# Patient Record
Sex: Female | Born: 1970 | Race: Black or African American | Hispanic: No | Marital: Single | Smoking: Never smoker
Health system: Southern US, Community
[De-identification: ages and names within clinical notes are randomized; demographics above are authoritative.]

## PROBLEM LIST (undated history)

## (undated) DIAGNOSIS — E7800 Pure hypercholesterolemia, unspecified: Secondary | ICD-10-CM

## (undated) DIAGNOSIS — I10 Essential (primary) hypertension: Secondary | ICD-10-CM

## (undated) HISTORY — PX: HERNIA REPAIR: SHX51

---

## 1997-10-27 ENCOUNTER — Emergency Department (HOSPITAL_COMMUNITY): Admission: EM | Admit: 1997-10-27 | Discharge: 1997-10-27 | Payer: Self-pay | Admitting: Emergency Medicine

## 2004-07-12 ENCOUNTER — Emergency Department (HOSPITAL_COMMUNITY): Admission: EM | Admit: 2004-07-12 | Discharge: 2004-07-13 | Payer: Self-pay | Admitting: Emergency Medicine

## 2005-02-23 ENCOUNTER — Ambulatory Visit (HOSPITAL_COMMUNITY): Admission: RE | Admit: 2005-02-23 | Discharge: 2005-02-23 | Payer: Self-pay | Admitting: Orthopedic Surgery

## 2005-12-19 ENCOUNTER — Emergency Department (HOSPITAL_COMMUNITY): Admission: EM | Admit: 2005-12-19 | Discharge: 2005-12-19 | Payer: Self-pay | Admitting: Family Medicine

## 2008-10-09 ENCOUNTER — Emergency Department (HOSPITAL_BASED_OUTPATIENT_CLINIC_OR_DEPARTMENT_OTHER): Admission: EM | Admit: 2008-10-09 | Discharge: 2008-10-09 | Payer: Self-pay | Admitting: Emergency Medicine

## 2009-04-26 ENCOUNTER — Ambulatory Visit: Payer: Self-pay | Admitting: Diagnostic Radiology

## 2009-04-26 ENCOUNTER — Emergency Department (HOSPITAL_BASED_OUTPATIENT_CLINIC_OR_DEPARTMENT_OTHER): Admission: EM | Admit: 2009-04-26 | Discharge: 2009-04-26 | Payer: Self-pay | Admitting: Emergency Medicine

## 2009-04-26 IMAGING — CT CT ABD-PELV W/ CM
2 of 5 series · 15 of 46 positions shown, 17 images · IV contrast (APPLIED)
Comparison: Abdominal radiograph performed earlier today at [DATE]
a.m.

CLINICAL DATA: Abdominal pain and nausea.  History of abdominal
wall hernia.

CT ABDOMEN AND PELVIS WITH CONTRAST
TECHNIQUE: Multidetector CT imaging of the abdomen and pelvis was
performed following the standard protocol during bolus
administration of intravenous contrast.
Contrast: 100 mL of Omnipaque 300 IV contrast

[Series 2: abd/pelvis 5.0 b31f · axial · 0.78mm/px · z∈[-399,+6]mm · 12 of 93 slices shown, 14 images]
[im 6/93  soft-tissue]
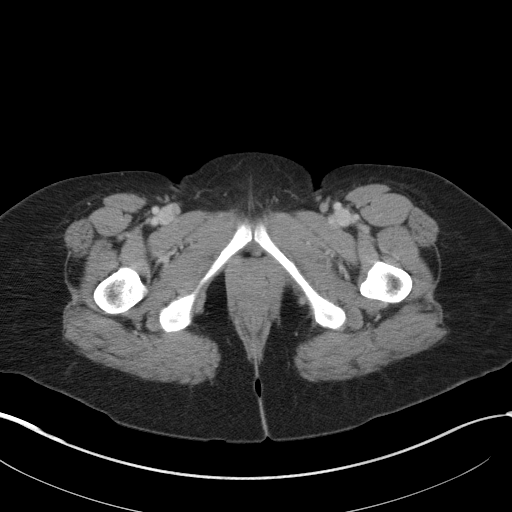
[im 6/93  bone]
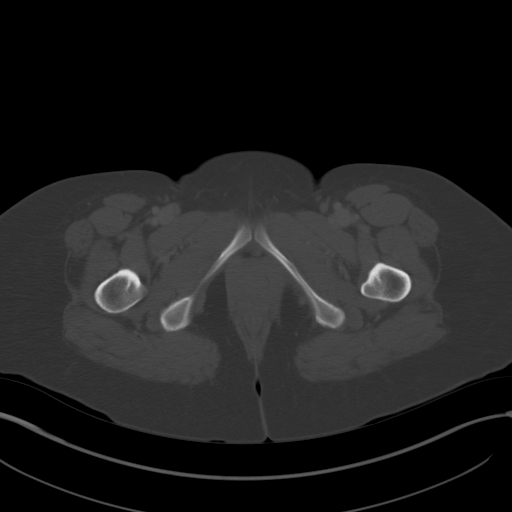
[im 16/93  soft-tissue]
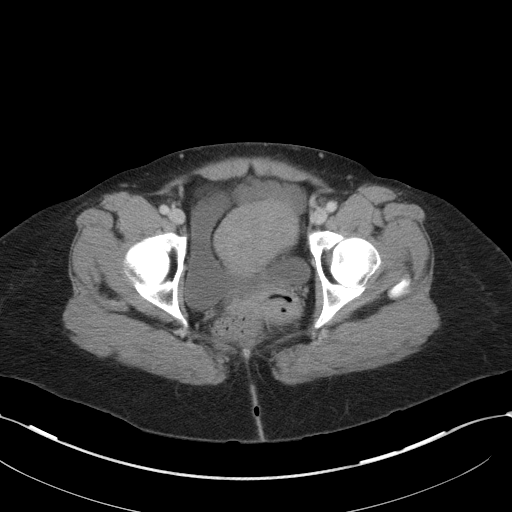
[im 21/93  soft-tissue]
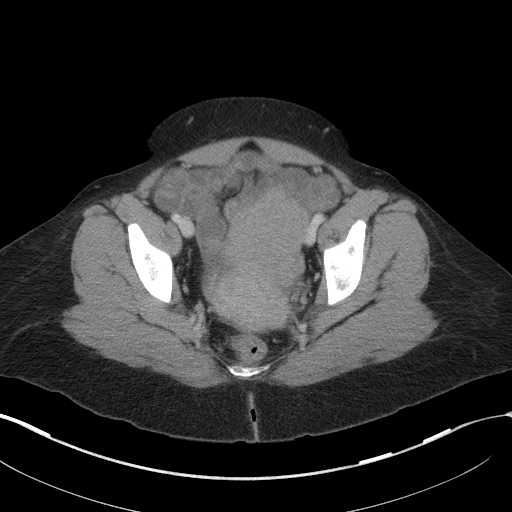
[im 26/93  soft-tissue]
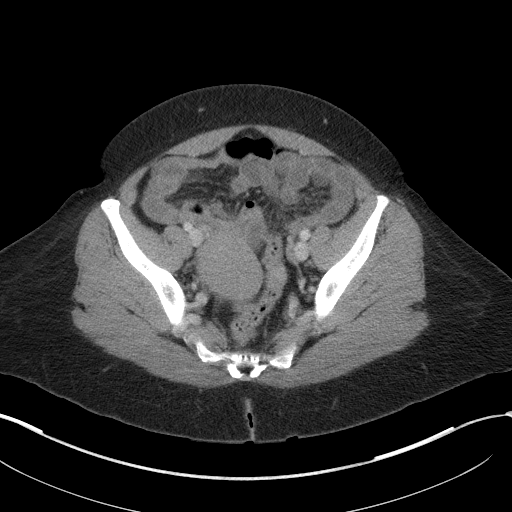
[im 36/93  soft-tissue]
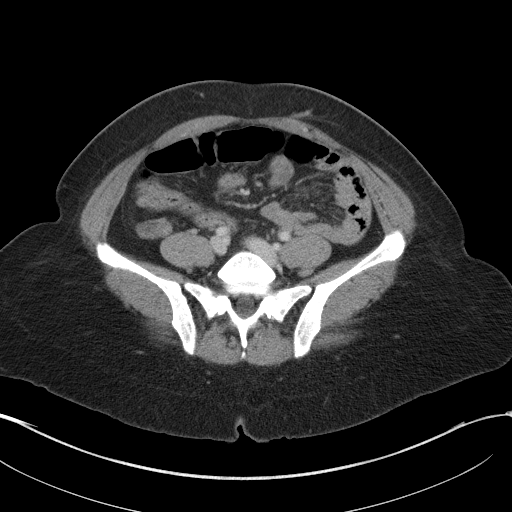
[im 41/93  soft-tissue]
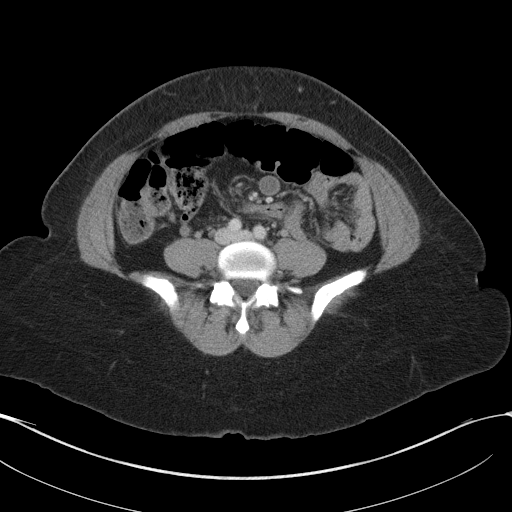
[im 52/93  soft-tissue]
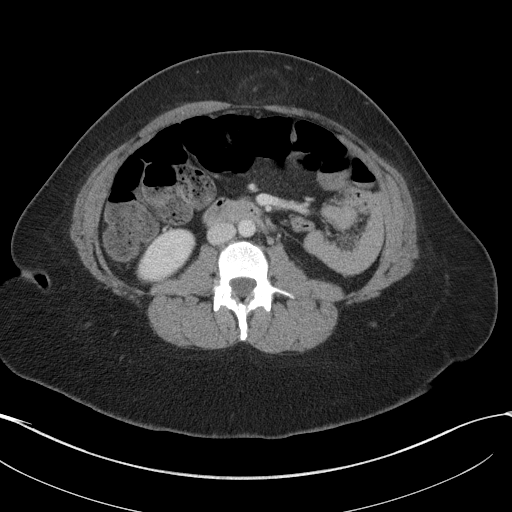
[im 57/93  soft-tissue]
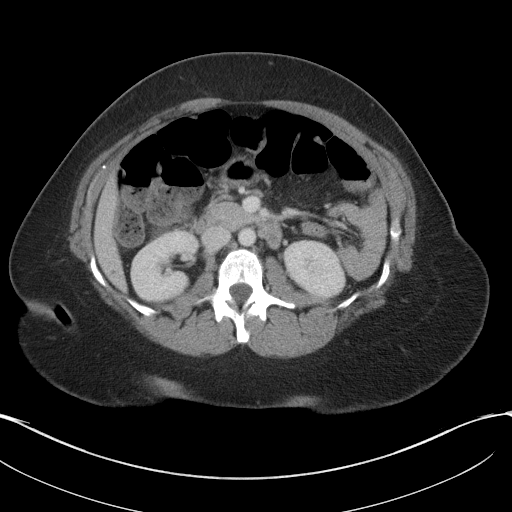
[im 67/93  soft-tissue]
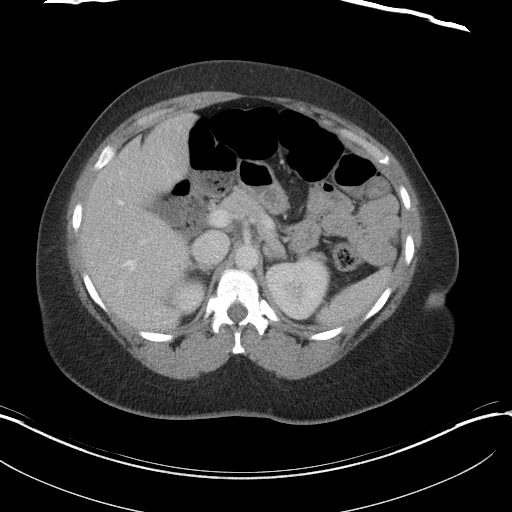
[im 67/93  bone]
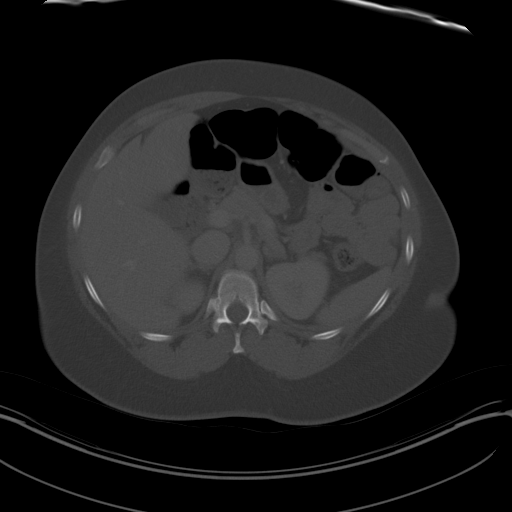
[im 72/93  soft-tissue]
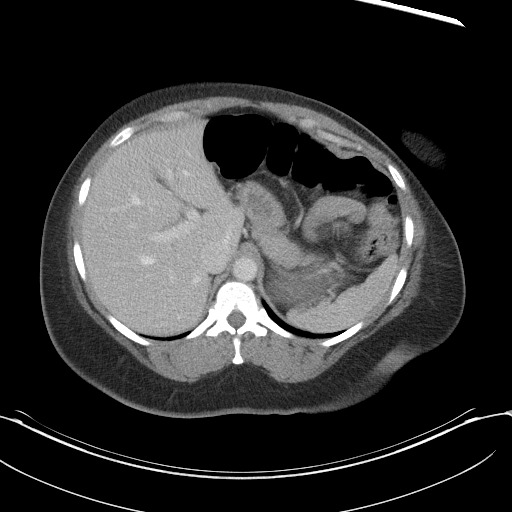
[im 77/93  soft-tissue]
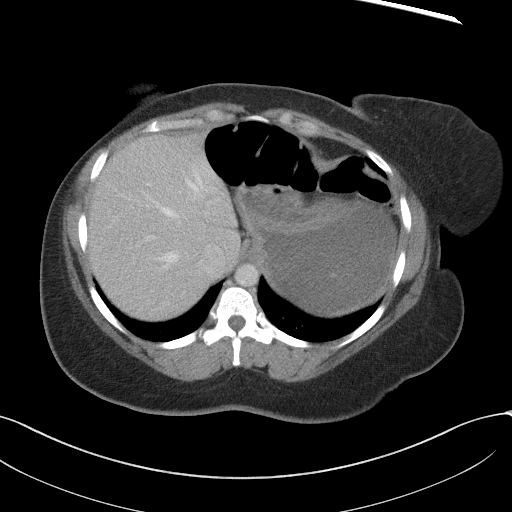
[im 87/93  soft-tissue]
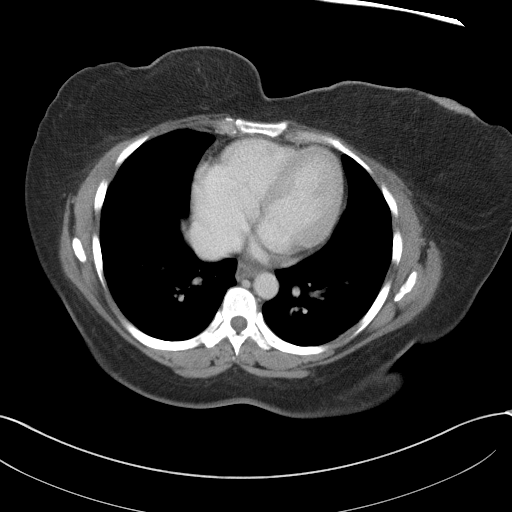

[Series 5: abd/pelvis 3.0 coronal · coronal · 0.81mm/px · 3 of 99 slices shown]
[im 33/99  soft-tissue]
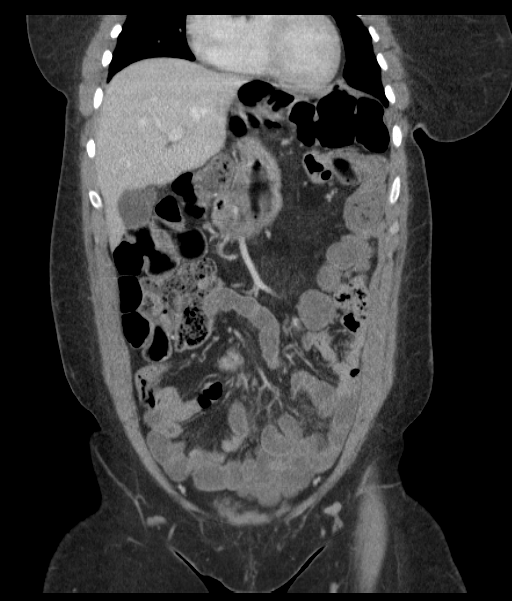
[im 44/99  soft-tissue]
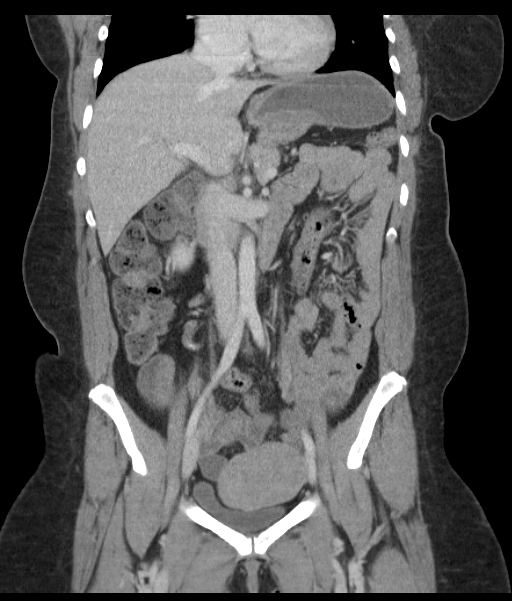
[im 55/99  soft-tissue]
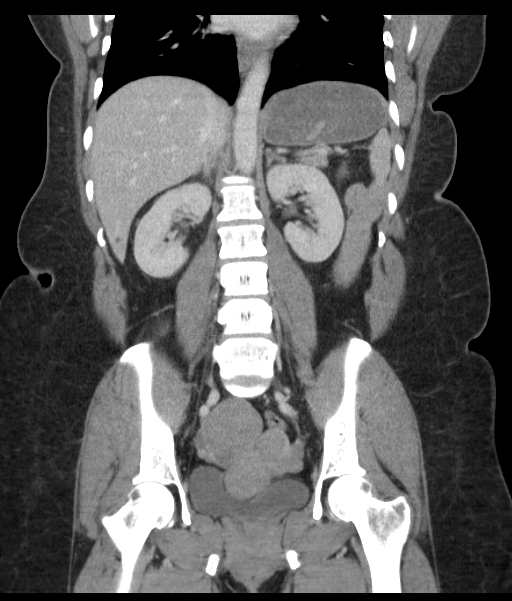

[15 of 46 positions shown; findings below may reference images not displayed]

FINDINGS: The visualized lung bases are clear.

A 5 mm hypodensity is noted at the inferior tip of the liver; a
second 5 mm hypodensity is seen more superiorly within the right
hepatic lobe, while a 4 mm hypodensity is noted along the posterior
hepatic dome.  These are too small to further characterize, but
most likely reflect hepatic cysts.  The liver is otherwise
unremarkable in appearance.

The spleen is within normal limits.  The gallbladder is normal in
appearance.  The pancreas and adrenal glands are unremarkable.  The
kidneys are within normal limits bilaterally; no hydronephrosis or
perinephric stranding is seen.

No free fluid is identified.  The small bowel is unremarkable in
appearance.  The stomach is within normal limits.  No acute
vascular abnormalities are seen.

Two midline ventral abdominal wall hernias are noted, superior to
the umbilicus.  The larger of these measures 5.2 cm in length, and
demonstrates trace fluid and associated stranding, suggestive of
mild inflammation of the incarcerated omentum.  The smaller hernia
is seen directly superior to the umbilicus, measuring 2.4 cm in
length.  No herniated bowel is seen.

The colon is mildly distended with air and stool, with relative
decompression of the descending and distal sigmoid colon.  There is
no evidence of obstruction.  The appendix is normal in caliber,
without evidence for acute appendicitis.

The bladder is mildly distended and unremarkable in appearance.
Multiple large masses are seen arising from the uterus, likely
reflect multiple exophytic fibroids.  The largest two fibroids
measure 6.2 cm and 6.0 cm in size.  No inguinal lymphadenopathy is
appreciated.

No acute osseous abnormalities are identified.
IMPRESSION: 1.  Two midline ventral abdominal wall hernias noted, superior to
the umbilicus.  The larger hernia measures 5.2 cm, and demonstrates
trace fluid and associated stranding, suggestive of mild
inflammation of the incarcerated omentum.
2.  No evidence of bowel obstruction; the colon is mildly distended
with air and stool.
3.  Multiple large exophytic fibroids noted.
4.  Tiny hypodensities within the liver are too small to further
characterize, but most likely reflect hepatic cysts.

## 2009-04-26 IMAGING — CR DG ABDOMEN 2V
3 series · 3 of 3 positions shown · non-contrast
Comparison: None

CLINICAL DATA: Left mid abdominal pain.

ABDOMEN - 2 VIEW

[w abdomen upright]
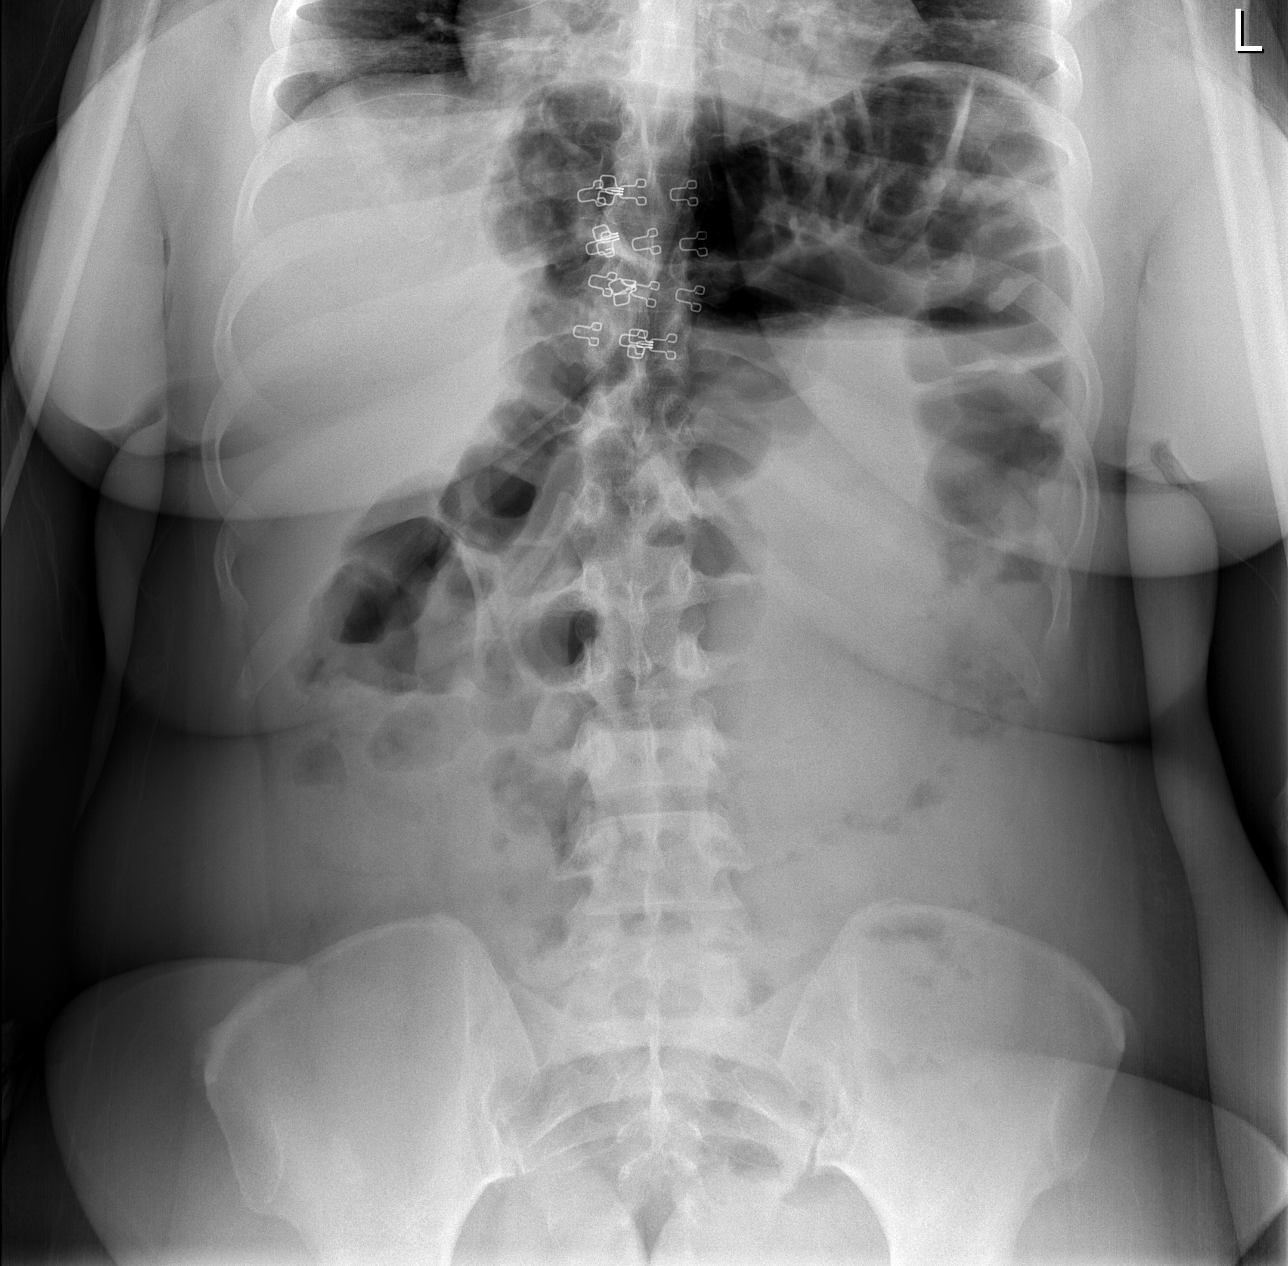

[t abdomen supine (1 of 2)]
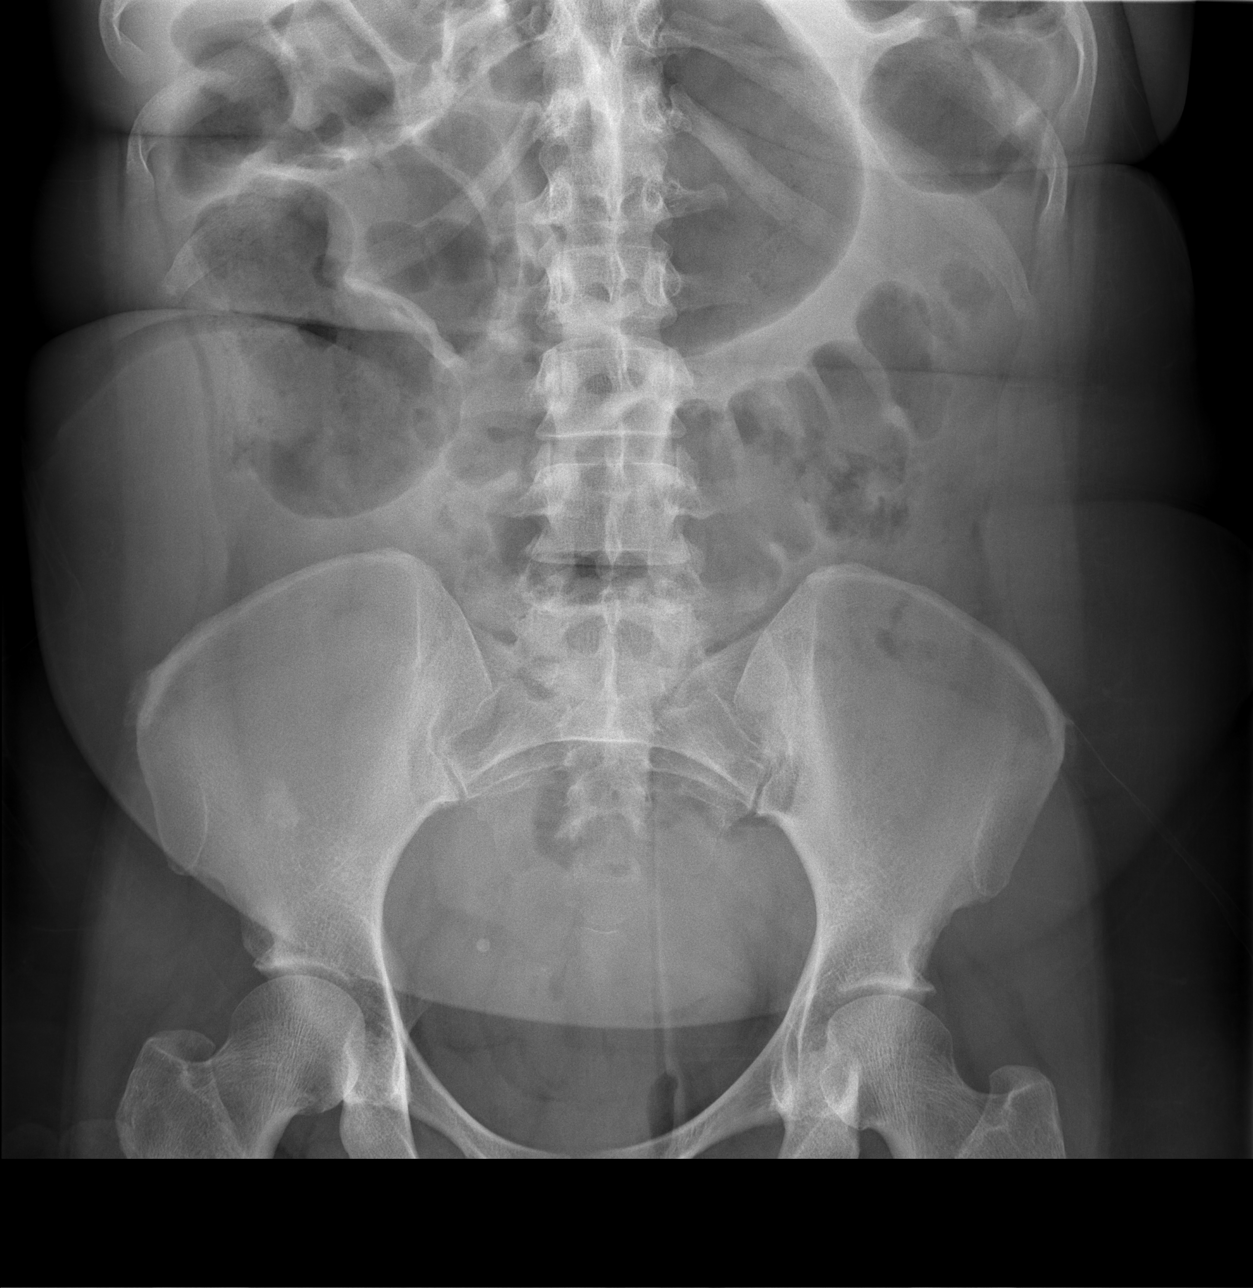

[t abdomen supine (2 of 2)]
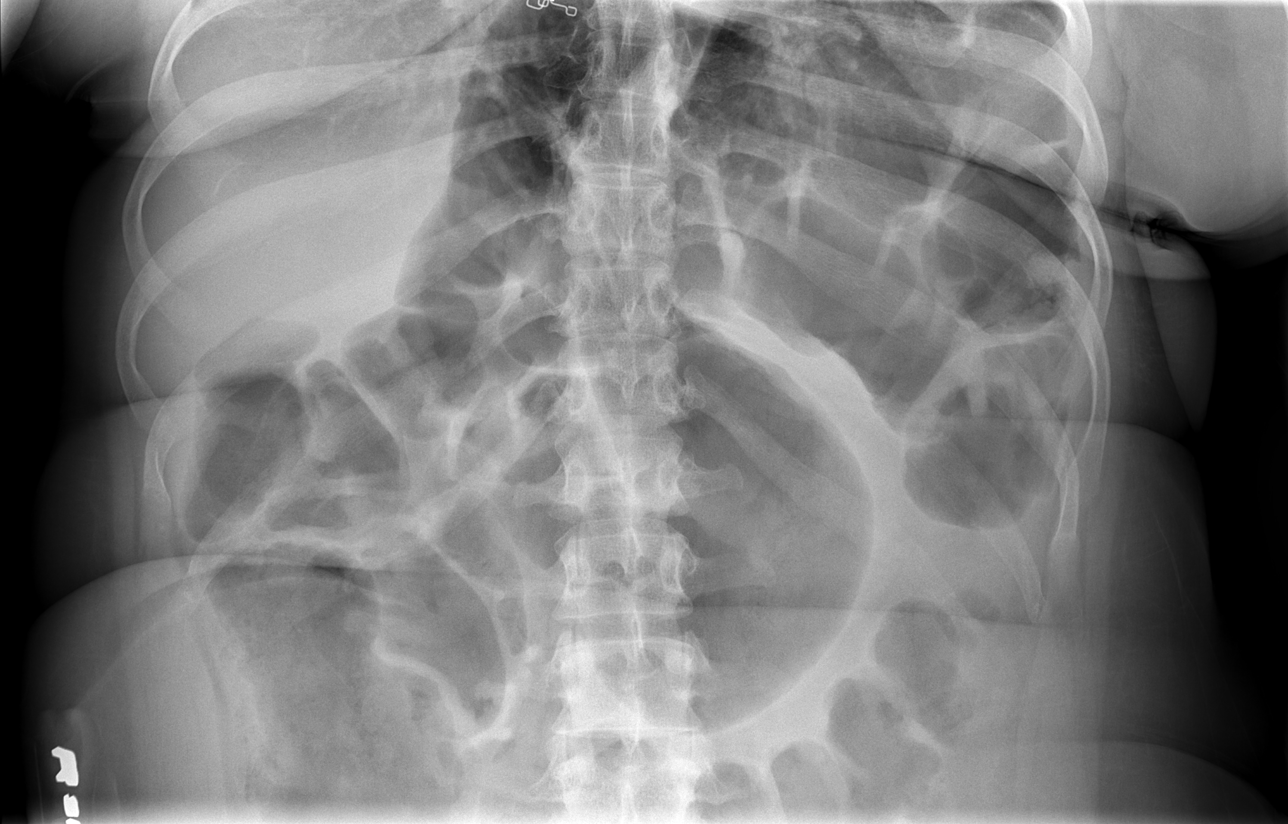

[3 of 3 positions shown; findings below may reference images not displayed]

FINDINGS: The visualized bowel gas pattern is nonspecific.  A large
amount of air is noted within the proximal colon, with mild
distension of the cecum, at the midline.  There is no definite
evidence for volvulus.  There is no evidence of small bowel
dilatation to suggest obstruction.  No free intra-abdominal air is
identified on the provided upright view.

No acute osseous abnormalities are seen; the sacroiliac joints are
unremarkable in appearance. The visualized lung bases are grossly
clear.
IMPRESSION: Nonspecific bowel gas pattern; no definite evidence of obstruction.
No free intra-abdominal air seen.

## 2010-06-18 ENCOUNTER — Emergency Department (HOSPITAL_BASED_OUTPATIENT_CLINIC_OR_DEPARTMENT_OTHER)
Admission: EM | Admit: 2010-06-18 | Discharge: 2010-06-18 | Disposition: A | Discharge status: Home / Self Care | Payer: Managed Care, Other (non HMO) | Attending: Emergency Medicine | Admitting: Emergency Medicine

## 2010-06-18 DIAGNOSIS — E7800 Pure hypercholesterolemia, unspecified: Secondary | ICD-10-CM | POA: Insufficient documentation

## 2010-06-18 DIAGNOSIS — M542 Cervicalgia: Secondary | ICD-10-CM | POA: Insufficient documentation

## 2010-06-18 DIAGNOSIS — I10 Essential (primary) hypertension: Secondary | ICD-10-CM | POA: Insufficient documentation

## 2010-06-21 LAB — DIFFERENTIAL
Basophils Absolute: 0 10*3/uL (ref 0.0–0.1)
Lymphocytes Relative: 31 % (ref 12–46)
Lymphs Abs: 1.6 10*3/uL (ref 0.7–4.0)
Monocytes Absolute: 0.2 10*3/uL (ref 0.1–1.0)
Neutro Abs: 3.2 10*3/uL (ref 1.7–7.7)

## 2010-06-21 LAB — COMPREHENSIVE METABOLIC PANEL
ALT: 29 U/L (ref 0–35)
Alkaline Phosphatase: 57 U/L (ref 39–117)
CO2: 32 mEq/L (ref 19–32)
Chloride: 100 mEq/L (ref 96–112)
GFR calc Af Amer: >60 mL/min (ref >60)
Glucose, Bld: 101 mg/dL — ABNORMAL HIGH (ref 70–99)
Potassium: 3.6 mEq/L (ref 3.5–5.1)
Sodium: 142 mEq/L (ref 135–145)
Total Protein: 7.8 g/dL (ref 6.0–8.3)

## 2010-06-21 LAB — URINALYSIS, ROUTINE W REFLEX MICROSCOPIC
Hgb urine dipstick: NEGATIVE
Ketones, ur: NEGATIVE mg/dL
Nitrite: NEGATIVE
Protein, ur: NEGATIVE mg/dL
Specific Gravity, Urine: 1.018 (ref 1.005–1.030)

## 2010-06-21 LAB — PREGNANCY, URINE: Preg Test, Ur: NEGATIVE

## 2010-06-21 LAB — URINE MICROSCOPIC-ADD ON

## 2010-06-21 LAB — CBC
HCT: 34 % — ABNORMAL LOW (ref 36.0–46.0)
Hemoglobin: 11.7 g/dL — ABNORMAL LOW (ref 12.0–15.0)
Platelets: 227 10*3/uL (ref 150–400)
RBC: 3.41 MIL/uL — ABNORMAL LOW (ref 3.87–5.11)

## 2010-06-21 LAB — LIPASE, BLOOD: Lipase: 47 U/L (ref 23–300)

## 2010-07-15 ENCOUNTER — Emergency Department (HOSPITAL_BASED_OUTPATIENT_CLINIC_OR_DEPARTMENT_OTHER)
Admission: EM | Admit: 2010-07-15 | Discharge: 2010-07-15 | Disposition: A | Discharge status: Home / Self Care | Payer: Managed Care, Other (non HMO) | Attending: Emergency Medicine | Admitting: Emergency Medicine

## 2010-07-15 ENCOUNTER — Emergency Department (INDEPENDENT_AMBULATORY_CARE_PROVIDER_SITE_OTHER): Payer: Managed Care, Other (non HMO)

## 2010-07-15 DIAGNOSIS — I10 Essential (primary) hypertension: Secondary | ICD-10-CM | POA: Insufficient documentation

## 2010-07-15 DIAGNOSIS — W64XXXA Exposure to other animate mechanical forces, initial encounter: Secondary | ICD-10-CM

## 2010-07-15 DIAGNOSIS — X58XXXA Exposure to other specified factors, initial encounter: Secondary | ICD-10-CM | POA: Insufficient documentation

## 2010-07-15 DIAGNOSIS — E7800 Pure hypercholesterolemia, unspecified: Secondary | ICD-10-CM | POA: Insufficient documentation

## 2010-07-15 DIAGNOSIS — S40019A Contusion of unspecified shoulder, initial encounter: Secondary | ICD-10-CM

## 2011-09-06 ENCOUNTER — Emergency Department (HOSPITAL_BASED_OUTPATIENT_CLINIC_OR_DEPARTMENT_OTHER): Payer: Managed Care, Other (non HMO)

## 2011-09-06 ENCOUNTER — Encounter (HOSPITAL_BASED_OUTPATIENT_CLINIC_OR_DEPARTMENT_OTHER): Payer: Self-pay

## 2011-09-06 ENCOUNTER — Emergency Department (HOSPITAL_BASED_OUTPATIENT_CLINIC_OR_DEPARTMENT_OTHER)
Admission: EM | Admit: 2011-09-06 | Discharge: 2011-09-06 | Disposition: A | Discharge status: Home / Self Care | Payer: Managed Care, Other (non HMO) | Attending: Emergency Medicine | Admitting: Emergency Medicine

## 2011-09-06 DIAGNOSIS — S139XXA Sprain of joints and ligaments of unspecified parts of neck, initial encounter: Secondary | ICD-10-CM | POA: Insufficient documentation

## 2011-09-06 DIAGNOSIS — I10 Essential (primary) hypertension: Secondary | ICD-10-CM | POA: Insufficient documentation

## 2011-09-06 DIAGNOSIS — M25569 Pain in unspecified knee: Secondary | ICD-10-CM | POA: Insufficient documentation

## 2011-09-06 DIAGNOSIS — S161XXA Strain of muscle, fascia and tendon at neck level, initial encounter: Secondary | ICD-10-CM

## 2011-09-06 DIAGNOSIS — M542 Cervicalgia: Secondary | ICD-10-CM | POA: Insufficient documentation

## 2011-09-06 DIAGNOSIS — M1710 Unilateral primary osteoarthritis, unspecified knee: Secondary | ICD-10-CM | POA: Insufficient documentation

## 2011-09-06 HISTORY — DX: Essential (primary) hypertension: I10
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
## 2011-09-06 MED ORDER — CYCLOBENZAPRINE HCL 5 MG PO TABS: 5.0000 mg | ORAL_TABLET | Freq: Two times a day (BID) | ORAL | Status: AC | PRN

## 2011-09-06 MED ORDER — HYDROCODONE-ACETAMINOPHEN 5-500 MG PO TABS: 1.0000 | ORAL_TABLET | Freq: Four times a day (QID) | ORAL | Status: AC | PRN

## 2011-09-06 NOTE — Discharge Instructions (Signed)
Cervical Sprain A cervical sprain is when the ligaments in the neck stretch or tear. The ligaments are the tissues that hold the neck bones in place. HOME CARE   Put ice on the injured area.   Put ice in a plastic bag.   Place a towel between your skin and the bag.   Leave the ice on for 15 to 20 minutes, 3 to 4 times a day.   Only take medicine as told by your doctor.   Keep all doctor visits as told.   Keep all physical therapy visits as told.   If your doctor gives you a neck collar, wear it as told.   Do not drive while wearing a neck collar.   Adjust your work station so that you have good posture while you work.   Avoid positions and activities that make your problems worse.   Warm up and stretch before being active.  GET HELP RIGHT AWAY IF:   You are bleeding or your stomach is upset.   You have an allergic reaction to your medicine.   Your problems (symptoms) get worse.   You develop new problems.   You lose feeling (numbness) or you cannot move (paralysis) any part of your body.   You have tingling or weakness in any part of your body.   Your pain is not controlled with medicine.   You cannot take less pain medicine over time as planned.   Your activity level does not improve as expected.  MAKE SURE YOU:   Understand these instructions.   Will watch your condition.   Will get help right away if you are not doing well or get worse.  Document Released: 09/08/2007 Document Revised: 03/11/2011 Document Reviewed: 12/24/2010 [facility] Patient Information 2012 [place], [place].Arthritis, Nonspecific Arthritis is pain, redness, warmth, or puffiness (swelling) of a joint. The joint may be stiff or hurt when you move it. One or more joints may be affected. There are many types of arthritis. Your doctor may not know what type you have right away. The most common cause of arthritis is wear and tear on the joint (osteoarthritis). HOME CARE   Only take medicine as  told by your doctor.   Rest the joint as much as possible.   Raise (elevate) your joint if it is puffy.   Use crutches if the painful joint is in your leg.   Drink enough water and fluids to keep your pee (urine) clear or pale yellow.   Follow your doctor's instructions for diet.   Use cold packs for very bad joint pain for 10 to 15 minutes every hour. Ask your doctor if it is okay for you to use hot packs.   Exercise as told by your doctor.   Take a warm shower if you have stiffness in the morning.   Move your sore joints throughout the day.  GET HELP RIGHT AWAY IF:   You do not feel better in 24 hours or are getting worse.   You are having side effects from your medicine.   You are not getting better with treatment.   You have a fever.   You have very bad joint pain, puffiness, or redness.   Many joints become painful and puffy.   You have very bad back pain or leg weakness.   You cannot control when you poop (bowel movement) or pee (urinate).  MAKE SURE YOU:   Understand these instructions.   Will watch your condition.   Will  get help right away if you are not doing well or get worse.  Document Released: 06/16/2009 Document Revised: 03/11/2011 Document Reviewed: 06/16/2009 [facility] Patient Information 2012 [place], [place].
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
## 2011-09-06 NOTE — ED Notes (Signed)
PA at bedside.

## 2011-09-08 NOTE — ED Provider Notes (Signed)
Medical screening examination/treatment/procedure(s) were performed by non-physician practitioner and as supervising physician I was immediately available for consultation/collaboration.  Cote Mayabb, MD 09/08/11 0815 
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
## 2012-05-28 ENCOUNTER — Emergency Department (HOSPITAL_BASED_OUTPATIENT_CLINIC_OR_DEPARTMENT_OTHER)
Admission: EM | Admit: 2012-05-28 | Discharge: 2012-05-28 | Disposition: A | Discharge status: Home / Self Care | Payer: Managed Care, Other (non HMO) | Attending: Emergency Medicine | Admitting: Emergency Medicine

## 2012-05-28 ENCOUNTER — Emergency Department (HOSPITAL_BASED_OUTPATIENT_CLINIC_OR_DEPARTMENT_OTHER): Payer: Managed Care, Other (non HMO)

## 2012-05-28 ENCOUNTER — Encounter (HOSPITAL_BASED_OUTPATIENT_CLINIC_OR_DEPARTMENT_OTHER): Payer: Self-pay | Admitting: *Deleted

## 2012-05-28 DIAGNOSIS — I10 Essential (primary) hypertension: Secondary | ICD-10-CM | POA: Insufficient documentation

## 2012-05-28 DIAGNOSIS — S4990XA Unspecified injury of shoulder and upper arm, unspecified arm, initial encounter: Secondary | ICD-10-CM

## 2012-05-28 DIAGNOSIS — Z7982 Long term (current) use of aspirin: Secondary | ICD-10-CM | POA: Insufficient documentation

## 2012-05-28 DIAGNOSIS — Z79899 Other long term (current) drug therapy: Secondary | ICD-10-CM | POA: Insufficient documentation

## 2012-05-28 DIAGNOSIS — Y9389 Activity, other specified: Secondary | ICD-10-CM | POA: Insufficient documentation

## 2012-05-28 DIAGNOSIS — Y92410 Unspecified street and highway as the place of occurrence of the external cause: Secondary | ICD-10-CM | POA: Insufficient documentation

## 2012-05-28 DIAGNOSIS — S0993XA Unspecified injury of face, initial encounter: Secondary | ICD-10-CM | POA: Insufficient documentation

## 2012-05-28 DIAGNOSIS — S4980XA Other specified injuries of shoulder and upper arm, unspecified arm, initial encounter: Secondary | ICD-10-CM | POA: Insufficient documentation

## 2012-05-28 DIAGNOSIS — S46909A Unspecified injury of unspecified muscle, fascia and tendon at shoulder and upper arm level, unspecified arm, initial encounter: Secondary | ICD-10-CM | POA: Insufficient documentation
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
## 2012-05-28 NOTE — ED Notes (Signed)
MD at bedside. 

## 2012-05-28 NOTE — ED Notes (Signed)
Taryn, emt at bedside applying arm sling

## 2012-05-28 NOTE — ED Notes (Signed)
Sling applied.  CMS present pre and post application.  Patient verbalized understanding of use, application and removal of sling.
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
## 2014-11-29 ENCOUNTER — Other Ambulatory Visit (HOSPITAL_COMMUNITY)
Admission: RE | Admit: 2014-11-29 | Discharge: 2014-11-29 | Disposition: A | Discharge status: Home / Self Care | Payer: BLUE CROSS/BLUE SHIELD | Source: Ambulatory Visit | Attending: Physician Assistant | Admitting: Physician Assistant

## 2014-11-29 ENCOUNTER — Other Ambulatory Visit: Payer: Self-pay | Admitting: Physician Assistant

## 2014-11-29 DIAGNOSIS — Z01419 Encounter for gynecological examination (general) (routine) without abnormal findings: Secondary | ICD-10-CM | POA: Insufficient documentation

## 2014-11-29 DIAGNOSIS — Z1151 Encounter for screening for human papillomavirus (HPV): Secondary | ICD-10-CM | POA: Diagnosis present

## 2014-12-02 LAB — CYTOLOGY - PAP

## 2015-02-14 ENCOUNTER — Other Ambulatory Visit (HOSPITAL_BASED_OUTPATIENT_CLINIC_OR_DEPARTMENT_OTHER): Payer: BLUE CROSS/BLUE SHIELD

## 2015-02-14 ENCOUNTER — Ambulatory Visit (HOSPITAL_BASED_OUTPATIENT_CLINIC_OR_DEPARTMENT_OTHER): Payer: BLUE CROSS/BLUE SHIELD | Admitting: Family

## 2015-02-14 ENCOUNTER — Encounter: Payer: Self-pay | Admitting: Family

## 2015-02-14 ENCOUNTER — Other Ambulatory Visit: Payer: Self-pay | Admitting: Family

## 2015-02-14 ENCOUNTER — Ambulatory Visit: Payer: BLUE CROSS/BLUE SHIELD

## 2015-02-14 VITALS — BP 111/48 | HR 80 | Temp 95.8°F

## 2015-02-14 DIAGNOSIS — D72819 Decreased white blood cell count, unspecified: Secondary | ICD-10-CM

## 2015-02-14 DIAGNOSIS — R718 Other abnormality of red blood cells: Secondary | ICD-10-CM

## 2015-02-14 DIAGNOSIS — D649 Anemia, unspecified: Secondary | ICD-10-CM

## 2015-02-14 LAB — CBC WITH DIFFERENTIAL (CANCER CENTER ONLY)
BASO#: 0 10*3/uL (ref 0.0–0.2)
BASO%: 0.5 % (ref 0.0–2.0)
EOS ABS: 0 10*3/uL (ref 0.0–0.5)
EOS%: 0.9 % (ref 0.0–7.0)
HEMATOCRIT: 32.8 % — AB (ref 34.8–46.6)
HGB: 10.7 g/dL — ABNORMAL LOW (ref 11.6–15.9)
LYMPH#: 1.7 10*3/uL (ref 0.9–3.3)
LYMPH%: 39.4 % (ref 14.0–48.0)
MCH: 33 pg (ref 26.0–34.0)
MCHC: 32.6 g/dL (ref 32.0–36.0)
MCV: 101 fL (ref 81–101)
MONO#: 0.4 10*3/uL (ref 0.1–0.9)
MONO%: 8.6 % (ref 0.0–13.0)
NEUT#: 2.2 10*3/uL (ref 1.5–6.5)
NEUT%: 50.6 % (ref 39.6–80.0)
Platelets: 226 10*3/uL (ref 145–400)
RBC: 3.24 10*6/uL — ABNORMAL LOW (ref 3.70–5.32)
RDW: 12.8 % (ref 11.1–15.7)
WBC: 4.3 10*3/uL (ref 3.9–10.0)

## 2015-02-14 LAB — CHCC SATELLITE - SMEAR

## 2015-02-14 NOTE — Progress Notes (Addendum)
Hematology/Oncology Consultation   Name: Charlyne QualeMarlene A Ambers      MRN: [id number]    Location: Room/bed info not found  Date: 02/14/2015 Time:3:20 PM   REFERRING PHYSICIAN: Johny BlamerWilliam Harris, MD  REASON FOR CONSULT: Persistent leukopenia   DIAGNOSIS:  1. Leukopenia 2. Iron deficiency anemia  HISTORY OF PRESENT ILLNESS: [name] is a very pleasant 44 yo [place] female with a recent history of iron deficiency anemia and leukopenia.  Her WBC count today is normal at 4.3. She has had no issue with infections. No fever, chills, n/v, cough, rash, headaches, SOB, chest pain, palpitations, abdominal pain, constipation or diarrhea. She has had some dizziness and lightheadedness . Her Hgb today is 10.7 with an MCV of 101.  She has had no episodes of bleeding or bruising. She has not had a cycle since 2003. She was given an injection by her gynecologist at that time that stopped her cycles. She still has her female organs. She has never been pregnant. No miscarriages.  He mother and brother have the sickle cell trait. She does not know of anyone in the family with the actual disease.  She has no personal cancer history. Her mother has small cell lymphoma and is seen here in our office as well. Her maternal grandmother had a history of cervical cancer.  She states that she had some swelling in her right groin 3 weeks ago while she had an abscessed tooth. She states that once she had a root canal the swelling went away.  She has no lymphadenopathy on assessment at this time.  No swelling, tenderness, numbness or tingling in her extremities. No c/o pain at this time.  She has a good appetite and is staying well hydrated. She denies any significant weight loss or gain.  She states that she has had her mammogram for this year and that the results were negative.  She is not a smoker and does not drink alcohol.  She has had an umbilical hernia repair x2.  She has worked for [employer] for over 17  years and enjoys her job.   ROS: All other 10 point review of systems is negative.   PAST MEDICAL HISTORY:   Past Medical History  Diagnosis Date  . Hypertension     ALLERGIES: No Known Allergies    MEDICATIONS:  Current Outpatient Prescriptions on File Prior to Visit  Medication Sig Dispense Refill  . aspirin-acetaminophen-caffeine (EXCEDRIN MIGRAINE) 250-250-65 MG per tablet Take 2 tablets by mouth every 6 (six) hours as needed. Patient used this medication for her headache.    . ibuprofen (ADVIL,MOTRIN) 200 MG tablet Take 400 mg by mouth every 6 (six) hours as needed. Patient used this medication for her body aches.    Marland Kitchen. lisinopril (PRINIVIL,ZESTRIL) 20 MG tablet Take 20 mg by mouth daily.     No current facility-administered medications on file prior to visit.     PAST SURGICAL HISTORY Past Surgical History  Procedure Laterality Date  . Hernia repair      FAMILY HISTORY: History reviewed. No pertinent family history.  SOCIAL HISTORY:  reports that she has never smoked. She does not have any smokeless tobacco history on file. She reports that she does not drink alcohol or use illicit drugs.  PERFORMANCE STATUS: The patient's performance status is 1 - Symptomatic but completely ambulatory  PHYSICAL EXAM: Most Recent Vital Signs: Blood pressure 111/48, pulse 80, temperature 95.8 F (35.4 C), temperature source Oral. BP 111/48 mmHg  Pulse  80  Temp(Src) 95.8 F (35.4 C) (Oral)  General Appearance:    Alert, cooperative, no distress, appears stated age  Head:    Normocephalic, without obvious abnormality, atraumatic  Eyes:    PERRL, conjunctiva/corneas clear, EOM's intact, fundi    benign, both eyes        Throat:   Lips, mucosa, and tongue normal; teeth and gums normal  Neck:   Supple, symmetrical, trachea midline, no adenopathy;    thyroid:  no enlargement/tenderness/nodules; no carotid   bruit or JVD  Back:     Symmetric, no curvature, ROM normal, no CVA  tenderness  Lungs:     Clear to auscultation bilaterally, respirations unlabored  Chest Wall:    No tenderness or deformity   Heart:    Regular rate and rhythm, S1 and S2 normal, no murmur, rub   or gallop     Abdomen:     Soft, non-tender, bowel sounds active all four quadrants,    no masses, no organomegaly        Extremities:   Extremities normal, atraumatic, no cyanosis or edema  Pulses:   2+ and symmetric all extremities  Skin:   Skin color, texture, turgor normal, no rashes or lesions  Lymph nodes:   Cervical, supraclavicular, and axillary nodes normal  Neurologic:   CNII-XII intact, normal strength, sensation and reflexes    throughout    LABORATORY DATA:  Results for orders placed or performed in visit on 02/14/15 (from the past 48 hour(s))  CBC with Differential [facility])     Status: Abnormal   Collection Time: 02/14/15  2:42 PM  Result Value Ref Range   WBC 4.3 3.9 - 10.0 10e3/uL   RBC 3.24 (L) 3.70 - 5.32 10e6/uL   HGB 10.7 (L) 11.6 - 15.9 g/dL   HCT 16.1 (L) 09.6 - 04.5 %   MCV 101 81 - 101 fL   MCH 33.0 26.0 - 34.0 pg   MCHC 32.6 32.0 - 36.0 g/dL   RDW 40.9 81.1 - 91.4 %   Platelets 226 145 - 400 10e3/uL   NEUT# 2.2 1.5 - 6.5 10e3/uL   LYMPH# 1.7 0.9 - 3.3 10e3/uL   MONO# 0.4 0.1 - 0.9 10e3/uL   Eosinophils Absolute 0.0 0.0 - 0.5 10e3/uL   BASO# 0.0 0.0 - 0.2 10e3/uL   NEUT% 50.6 39.6 - 80.0 %   LYMPH% 39.4 14.0 - 48.0 %   MONO% 8.6 0.0 - 13.0 %   EOS% 0.9 0.0 - 7.0 %   BASO% 0.5 0.0 - 2.0 %  [facility] - Smear     Status: None   Collection Time: 02/14/15  2:42 PM  Result Value Ref Range   Smear Result Smear Available       RADIOGRAPHY: No results found.     PATHOLOGY: None  ASSESSMENT/PLAN: Ms. Tugwell is a very pleasant 44 yo [place] American female with a recent history of iron deficiency anemia and leukopenia. Her WBC count at this time is 4.3 and she has had no issue with frequent infections. She has had several episodes of  dizziness/lightheadedness. She has had no syncopal episodes or falls.  Her Hgb is 10.7 with an MCV of 101. She has not had her cycle since 2003. No episodes of bleeding, bruising or petechiae.  She will start taking folic acid 800 mcg daily.  We will plan to see her back in 1 month for follow-up and repeat labs.  All questions were answered. She will  contact us with any questions or concerns. We can certainly see her much sooner if necessary.  She was discussed with and also seen by Dr. Myna Hidalgo and he is in agreement with the aforementioned.   [facility]     Addendum:  I saw and examined the patient with Sarah. We looked at her blood smear. Her blood smear showed some anisocytosis and poikilocytosis. She has a microcytic red cells. She had no rouleau formation. There were no schistocytes.  I really don't think that she will need any IV iron. Again, we will have to check her blood counts only see her back. She feels a little bit better.  She actually is a daughter of one of my patients. My patient did come with her today. As such, it was nice talking to them.  We spent about 45 minutes with them.  Cindee Lame

## 2015-02-17 ENCOUNTER — Other Ambulatory Visit: Payer: BLUE CROSS/BLUE SHIELD

## 2015-02-17 ENCOUNTER — Ambulatory Visit: Payer: BLUE CROSS/BLUE SHIELD

## 2015-02-17 ENCOUNTER — Ambulatory Visit: Payer: BLUE CROSS/BLUE SHIELD | Admitting: Family

## 2015-03-19 ENCOUNTER — Other Ambulatory Visit: Payer: BLUE CROSS/BLUE SHIELD

## 2015-03-19 ENCOUNTER — Ambulatory Visit: Payer: BLUE CROSS/BLUE SHIELD | Admitting: Family

## 2015-04-24 ENCOUNTER — Encounter (HOSPITAL_BASED_OUTPATIENT_CLINIC_OR_DEPARTMENT_OTHER): Payer: Self-pay | Admitting: Emergency Medicine

## 2015-04-24 ENCOUNTER — Emergency Department (HOSPITAL_BASED_OUTPATIENT_CLINIC_OR_DEPARTMENT_OTHER)
Admission: EM | Admit: 2015-04-24 | Discharge: 2015-04-25 | Disposition: A | Discharge status: Home / Self Care | Payer: BLUE CROSS/BLUE SHIELD | Attending: Emergency Medicine | Admitting: Emergency Medicine

## 2015-04-24 DIAGNOSIS — Z79899 Other long term (current) drug therapy: Secondary | ICD-10-CM | POA: Insufficient documentation

## 2015-04-24 DIAGNOSIS — I10 Essential (primary) hypertension: Secondary | ICD-10-CM | POA: Diagnosis not present

## 2015-04-24 DIAGNOSIS — Z791 Long term (current) use of non-steroidal anti-inflammatories (NSAID): Secondary | ICD-10-CM | POA: Diagnosis not present

## 2015-04-24 DIAGNOSIS — M25561 Pain in right knee: Secondary | ICD-10-CM

## 2015-04-24 DIAGNOSIS — Z87828 Personal history of other (healed) physical injury and trauma: Secondary | ICD-10-CM | POA: Insufficient documentation

## 2015-04-24 NOTE — ED Notes (Signed)
Pt states "I need a cortizone shot in my knee." pt reports pain in right knee x 1 week. Pt has had previous injury to right knee and having an exacerbation of pain.

## 2015-04-24 NOTE — ED Provider Notes (Signed)
By signing my name below, I, Gonzella Lex, attest that this documentation has been prepared under the direction and in the presence of Kristen N Ward, DO. Electronically Signed: Gonzella Lex, Scribe. 04/24/2015. 12:03 AM.  TIME SEEN: 11:50 PM  CHIEF COMPLAINT: Knee Pain  HPI: Vickie Mclean is a 45 y.o. female who presents to the Emergency Department complaining of right knee pain for the past week with extension and ambulation. She also reports associated, subjective suspicion of fluid in her right knee as well as edema. Pt reports that she has tried taking 800 mg Advil with no relief. She has also tried icing it, elevating it, and resting it with little relief. Pt denies any recent injury to her knee but reports that she fell and hit it on the steps in 2015 and as had intermittent knee pain since. States pain is worse with walking. Better when she is staying still. States she tried to schedule appointment with orthopedic doctor but could not get an appointment until March.   ROS: See HPI Constitutional: no fever  Eyes: no drainage  ENT: no runny nose   Cardiovascular:  no chest pain  Resp: no SOB  GI: no vomiting GU: no dysuria Integumentary: no rash  Allergy: no hives  Musculoskeletal: no leg swelling, right knee arthralgias   Neurological: no slurred speech ROS otherwise negative  PAST MEDICAL HISTORY/PAST SURGICAL HISTORY:  Past Medical History  Diagnosis Date  . Hypertension     MEDICATIONS:  Prior to Admission medications   Medication Sig Start Date End Date Taking? Authorizing Provider  aspirin-acetaminophen-caffeine (EXCEDRIN MIGRAINE) [phone] MG per tablet Take 2 tablets by mouth every 6 (six) hours as needed. Patient used this medication for her headache.    Historical Provider, MD  escitalopram (LEXAPRO) 10 MG tablet Take 10 mg by mouth every morning. 12/18/14   Historical Provider, MD  ibuprofen (ADVIL,MOTRIN) 200 MG tablet Take 400 mg by mouth  every 6 (six) hours as needed. Patient used this medication for her body aches.    Historical Provider, MD  lisinopril (PRINIVIL,ZESTRIL) 20 MG tablet Take 20 mg by mouth daily.    Historical Provider, MD  naproxen (NAPROSYN) 500 MG tablet Take 500 mg by mouth 2 (two) times daily. 11/29/14   Historical Provider, MD    ALLERGIES:  No Known Allergies  SOCIAL HISTORY:  Social History  Substance Use Topics  . Smoking status: Never Smoker   . Smokeless tobacco: Not on file  . Alcohol Use: No    FAMILY HISTORY: No family history on file.  EXAM: BP 127/85 mmHg  Pulse 99  Temp(Src) 97.8 F (36.6 C) (Oral)  Resp 18  Ht  (1.549 m)  Wt 170 lb (77.111 kg)  BMI 32.14 kg/m2  SpO2 98% CONSTITUTIONAL: Alert and oriented and responds appropriately to questions. Well-appearing; well-nourished, afebrile and nontoxic HEAD: Normocephalic EYES: Conjunctivae clear, PERRL ENT: normal nose; no rhinorrhea; moist mucous membranes; pharynx without lesions noted NECK: Supple, no meningismus, no LAD  CARD: RRR; S1 and S2 appreciated; no murmurs, no clicks, no rubs, no gallops RESP: Normal chest excursion without splinting or tachypnea; breath sounds clear and equal bilaterally; no wheezes, no rhonchi, no rales, no hypoxia or respiratory distress, speaking full sentences ABD/GI: Normal bowel sounds; non-distended; soft, non-tender, no rebound, no guarding BACK:  The back appears normal and is non-tender to palpation, there is no CVA tenderness EXT: Tenderness to palpation over the right anterior distal knee and tibia with no  joined fusion, no ligament laxity, no erythema or warmth, 2+ DP pulses bilaterally; Normal ROM in all joints; otherwise extremities are non-tender to palpation; no edema; normal capillary refill; no cyanosis    SKIN: Normal color for age and race; warm NEURO: Moves all extremities equally, sensation to light touch intact diffusely, normal gait PSYCH: The patient's mood and manner  are appropriate. Grooming and personal hygiene are appropriate.  MEDICAL DECISION MAKING: Patient here with right knee pain that has been present intermittently for 2 years. Offered x-ray but patient declines. States she has not had any new injury and there is no sign of fracture, dislocation on exam. She is neurovascularly intact distally. No tenderness over the joint line and no ligamentous laxity. She is tender to palpation however over the patellar tendon. Discussed with patient that this could be a meniscal injury, tendinitis, strain, arthritis. Discussed with her that all of these conditions would be treated similarly with rest, elevation, ice and NSAIDs. She is requesting that we drained fluid off of her knee today and provide her with a cortisone injection. Discussed with patient that we do not do these procedures from the emergency department. She does not have any sign of joint effusion on exam and have discussed this with her that I do not feel it would be able to drain any fluid from her joint given it does not appear swollen compared to the left knee. I feel that any swelling she may have is likely in the soft tissues. Also discussed with patient that sticking a needle into her joint space could put her at risk for infection and that this risk would outweigh any benefit especially if I feel like I cannot drain any fluid given there is no sign of joint effusion currently. Also discussed with patient that we do not provide cortisone injections from the emergency department but this is something that an outpatient orthopedic provider could do as well as some primary care providers. I have recommended that she continue anti-inflammatories, elevation, ice and rest. She is requesting something to put on her knee while she is at work to help with pain and she cannot take narcotics while working. We'll give her Lidoderm patches that she may try to put over this area to help with pain control while at work.  There is no sign of septic arthritis or gout on exam. I do not feel she needs any further emergent workup. Have provided her with outpatient sports medicine and orthopedic follow-up. We'll discharge with Mobic, Vicodin, Lidoderm patches. She verbalized understanding and is comfortable with this plan.         Layla Maw Ward, DO 04/25/15 [phone]

## 2015-04-24 NOTE — ED Notes (Signed)
Pt states she has been using Ibuprofen and heat without relief.

## 2015-04-25 MED ORDER — LIDOCAINE 5 % EX PTCH: 1.0000 | MEDICATED_PATCH | CUTANEOUS | Status: AC

## 2015-04-25 MED ORDER — MELOXICAM 15 MG PO TABS: 15.0000 mg | ORAL_TABLET | Freq: Every day | ORAL | Status: DC

## 2015-04-25 MED ORDER — ONDANSETRON 4 MG PO TBDP: 4.0000 mg | ORAL_TABLET | Freq: Three times a day (TID) | ORAL | Status: AC | PRN

## 2015-04-25 MED ORDER — HYDROCODONE-ACETAMINOPHEN 5-325 MG PO TABS
1.0000 | ORAL_TABLET | Freq: Four times a day (QID) | ORAL | Status: DC | PRN
Start: 2015-04-25 — End: 2020-06-17

## 2015-04-25 NOTE — Discharge Instructions (Signed)
Knee Pain °Knee pain is a very common symptom and can have many causes. Knee pain often goes away when you follow your health care provider's instructions for relieving pain and discomfort at home. However, knee pain can develop into a condition that needs treatment. Some conditions may include: °· Arthritis caused by wear and tear (osteoarthritis). °· Arthritis caused by swelling and irritation (rheumatoid arthritis or gout). °· A cyst or growth in your knee. °· An infection in your knee joint. °· An injury that will not heal. °· Damage, swelling, or irritation of the tissues that support your knee (torn ligaments or tendinitis). °If your knee pain continues, additional tests may be ordered to diagnose your condition. Tests may include X-rays or other imaging studies of your knee. You may also need to have fluid removed from your knee. Treatment for ongoing knee pain depends on the cause, but treatment may include: °· Medicines to relieve pain or swelling. °· Steroid injections in your knee. °· Physical therapy. °· Surgery. °HOME CARE INSTRUCTIONS °· Take medicines only as directed by your health care provider. °· Rest your knee and keep it raised (elevated) while you are resting. °· Do not do things that cause or worsen pain. °· Avoid high-impact activities or exercises, such as running, jumping rope, or doing jumping jacks. °· Apply ice to the knee area: °· Put ice in a plastic bag. °· Place a towel between your skin and the bag. °· Leave the ice on for 20 minutes, 2-3 times a day. °· Ask your health care provider if you should wear an elastic knee support. °· Keep a pillow under your knee when you sleep. °· Lose weight if you are overweight. Extra weight can put pressure on your knee. °· Do not use any tobacco products, including cigarettes, chewing tobacco, or electronic cigarettes. If you need help quitting, ask your health care provider. Smoking may slow the healing of any bone and joint problems that you may  have. °SEEK MEDICAL CARE IF: °· Your knee pain continues, changes, or gets worse. °· You have a fever along with knee pain. °· Your knee buckles or locks up. °· Your knee becomes more swollen. °SEEK IMMEDIATE MEDICAL CARE IF:  °· Your knee joint feels hot to the touch. °· You have chest pain or trouble breathing. °  °This information is not intended to replace advice given to you by your health care provider. Make sure you discuss any questions you have with your health care provider. °  °Document Released: 01/17/2007 Document Revised: 04/12/2014 Document Reviewed: 11/05/2013 °Elsevier Interactive Patient Education ©2016 [employer] ° ° ° °RICE for Routine Care of Injuries °The routine care of many injuries includes rest, ice, compression, and elevation (RICE therapy). RICE therapy is often recommended for injuries to soft tissues, such as a muscle strain, ligament injuries, bruises, and overuse injuries. It can also be used for some bony injuries. Using RICE therapy can help to relieve pain, lessen swelling, and enable your body to heal. °Rest °Rest is required to allow your body to heal. This usually involves reducing your normal activities and avoiding use of the injured part of your body. Generally, you can return to your normal activities when you are comfortable and have been given permission by your health care provider. °Ice °Icing your injury helps to keep the swelling down, and it lessens pain. Do not apply ice directly to your skin. °· Put ice in a plastic bag. °· Place a towel between your skin and the bag. °·   Leave the ice on for 20 minutes, 2-3 times a day. °Do this for as long as you are directed by your health care provider. °Compression °Compression means putting pressure on the injured area. Compression helps to keep swelling down, gives support, and helps with discomfort. Compression may be done with an elastic bandage. If an elastic bandage has been applied, follow these general tips: °· Remove  and reapply the bandage every 3-4 hours or as directed by your health care provider. °· Make sure the bandage is not wrapped too tightly, because this can cut off circulation. If part of your body beyond the bandage becomes blue, numb, cold, swollen, or more painful, your bandage is most likely too tight. If this occurs, remove your bandage and reapply it more loosely. °· See your health care provider if the bandage seems to be making your problems worse rather than better. °Elevation °Elevation means keeping the injured area raised. This helps to lessen swelling and decrease pain. If possible, your injured area should be elevated at or above the level of your heart or the center of your chest. °WHEN SHOULD I SEEK MEDICAL CARE? °You should seek medical care if: °· Your pain and swelling continue. °· Your symptoms are getting worse rather than improving. °These symptoms may indicate that further evaluation or further X-rays are needed. Sometimes, X-rays may not show a small broken bone (fracture) until a number of days later. Make a follow-up appointment with your health care provider. °WHEN SHOULD I SEEK IMMEDIATE MEDICAL CARE? °You should seek immediate medical care if: °· You have sudden severe pain at or below the area of your injury. °· You have redness or increased swelling around your injury. °· You have tingling or numbness at or below the area of your injury that does not improve after you remove the elastic bandage. °  °This information is not intended to replace advice given to you by your health care provider. Make sure you discuss any questions you have with your health care provider. °  °Document Released: 07/04/2000 Document Revised: 12/11/2014 Document Reviewed: 02/27/2014 °Elsevier Interactive Patient Education ©2016 [employer] ° °

## 2015-12-26 DIAGNOSIS — I10 Essential (primary) hypertension: Secondary | ICD-10-CM | POA: Diagnosis not present

## 2015-12-26 DIAGNOSIS — Z23 Encounter for immunization: Secondary | ICD-10-CM | POA: Diagnosis not present

## 2015-12-26 DIAGNOSIS — E669 Obesity, unspecified: Secondary | ICD-10-CM | POA: Diagnosis not present

## 2015-12-26 DIAGNOSIS — F4320 Adjustment disorder, unspecified: Secondary | ICD-10-CM | POA: Diagnosis not present

## 2016-01-23 DIAGNOSIS — M25561 Pain in right knee: Secondary | ICD-10-CM | POA: Diagnosis not present

## 2016-01-23 DIAGNOSIS — G8929 Other chronic pain: Secondary | ICD-10-CM | POA: Diagnosis not present

## 2016-06-11 DIAGNOSIS — E669 Obesity, unspecified: Secondary | ICD-10-CM | POA: Diagnosis not present

## 2016-06-11 DIAGNOSIS — Z6835 Body mass index (BMI) 35.0-35.9, adult: Secondary | ICD-10-CM | POA: Diagnosis not present

## 2016-06-11 DIAGNOSIS — Z713 Dietary counseling and surveillance: Secondary | ICD-10-CM | POA: Diagnosis not present

## 2016-06-11 DIAGNOSIS — F4320 Adjustment disorder, unspecified: Secondary | ICD-10-CM | POA: Diagnosis not present

## 2016-10-29 DIAGNOSIS — M25561 Pain in right knee: Secondary | ICD-10-CM | POA: Diagnosis not present

## 2016-10-29 DIAGNOSIS — M25562 Pain in left knee: Secondary | ICD-10-CM | POA: Diagnosis not present

## 2016-10-29 DIAGNOSIS — E669 Obesity, unspecified: Secondary | ICD-10-CM | POA: Diagnosis not present

## 2017-02-22 DIAGNOSIS — M25561 Pain in right knee: Secondary | ICD-10-CM | POA: Diagnosis not present

## 2017-02-22 DIAGNOSIS — M25562 Pain in left knee: Secondary | ICD-10-CM | POA: Diagnosis not present

## 2017-02-22 DIAGNOSIS — E669 Obesity, unspecified: Secondary | ICD-10-CM | POA: Diagnosis not present

## 2017-02-22 DIAGNOSIS — G8929 Other chronic pain: Secondary | ICD-10-CM | POA: Diagnosis not present

## 2017-02-22 DIAGNOSIS — I10 Essential (primary) hypertension: Secondary | ICD-10-CM | POA: Diagnosis not present

## 2017-07-21 DIAGNOSIS — F4320 Adjustment disorder, unspecified: Secondary | ICD-10-CM | POA: Diagnosis not present

## 2017-07-21 DIAGNOSIS — I10 Essential (primary) hypertension: Secondary | ICD-10-CM | POA: Diagnosis not present

## 2017-07-21 DIAGNOSIS — M25562 Pain in left knee: Secondary | ICD-10-CM | POA: Diagnosis not present

## 2017-07-21 DIAGNOSIS — R51 Headache: Secondary | ICD-10-CM | POA: Diagnosis not present

## 2017-07-21 DIAGNOSIS — M25561 Pain in right knee: Secondary | ICD-10-CM | POA: Diagnosis not present

## 2017-07-21 DIAGNOSIS — E669 Obesity, unspecified: Secondary | ICD-10-CM | POA: Diagnosis not present

## 2017-11-23 DIAGNOSIS — M25561 Pain in right knee: Secondary | ICD-10-CM | POA: Diagnosis not present

## 2017-11-23 DIAGNOSIS — I10 Essential (primary) hypertension: Secondary | ICD-10-CM | POA: Diagnosis not present

## 2017-11-23 DIAGNOSIS — F4321 Adjustment disorder with depressed mood: Secondary | ICD-10-CM | POA: Diagnosis not present

## 2017-11-23 DIAGNOSIS — R51 Headache: Secondary | ICD-10-CM | POA: Diagnosis not present

## 2018-04-14 DIAGNOSIS — Z23 Encounter for immunization: Secondary | ICD-10-CM | POA: Diagnosis not present

## 2018-04-26 DIAGNOSIS — I10 Essential (primary) hypertension: Secondary | ICD-10-CM | POA: Diagnosis not present

## 2018-04-26 DIAGNOSIS — F4321 Adjustment disorder with depressed mood: Secondary | ICD-10-CM | POA: Diagnosis not present

## 2018-04-26 DIAGNOSIS — K5900 Constipation, unspecified: Secondary | ICD-10-CM | POA: Diagnosis not present

## 2018-04-26 DIAGNOSIS — R51 Headache: Secondary | ICD-10-CM | POA: Diagnosis not present

## 2018-09-22 DIAGNOSIS — F4320 Adjustment disorder, unspecified: Secondary | ICD-10-CM | POA: Diagnosis not present

## 2018-09-22 DIAGNOSIS — L299 Pruritus, unspecified: Secondary | ICD-10-CM | POA: Diagnosis not present

## 2018-09-22 DIAGNOSIS — K5900 Constipation, unspecified: Secondary | ICD-10-CM | POA: Diagnosis not present

## 2018-09-22 DIAGNOSIS — I10 Essential (primary) hypertension: Secondary | ICD-10-CM | POA: Diagnosis not present

## 2018-11-06 DIAGNOSIS — L299 Pruritus, unspecified: Secondary | ICD-10-CM | POA: Diagnosis not present

## 2019-07-11 DIAGNOSIS — I10 Essential (primary) hypertension: Secondary | ICD-10-CM | POA: Diagnosis not present

## 2019-07-11 DIAGNOSIS — R519 Headache, unspecified: Secondary | ICD-10-CM | POA: Diagnosis not present

## 2019-07-11 DIAGNOSIS — K5900 Constipation, unspecified: Secondary | ICD-10-CM | POA: Diagnosis not present

## 2019-07-11 DIAGNOSIS — F4321 Adjustment disorder with depressed mood: Secondary | ICD-10-CM | POA: Diagnosis not present

## 2020-03-03 ENCOUNTER — Encounter (HOSPITAL_BASED_OUTPATIENT_CLINIC_OR_DEPARTMENT_OTHER): Payer: Self-pay | Admitting: *Deleted

## 2020-03-03 ENCOUNTER — Emergency Department (HOSPITAL_BASED_OUTPATIENT_CLINIC_OR_DEPARTMENT_OTHER): Payer: BC Managed Care – PPO

## 2020-03-03 ENCOUNTER — Emergency Department (HOSPITAL_BASED_OUTPATIENT_CLINIC_OR_DEPARTMENT_OTHER)
Admission: EM | Admit: 2020-03-03 | Discharge: 2020-03-03 | Disposition: A | Discharge status: Home / Self Care | Payer: BC Managed Care – PPO | Attending: Emergency Medicine | Admitting: Emergency Medicine

## 2020-03-03 ENCOUNTER — Other Ambulatory Visit: Payer: Self-pay

## 2020-03-03 DIAGNOSIS — M79605 Pain in left leg: Secondary | ICD-10-CM | POA: Diagnosis not present

## 2020-03-03 DIAGNOSIS — M7989 Other specified soft tissue disorders: Secondary | ICD-10-CM | POA: Diagnosis not present

## 2020-03-03 DIAGNOSIS — I10 Essential (primary) hypertension: Secondary | ICD-10-CM | POA: Insufficient documentation

## 2020-03-03 DIAGNOSIS — M1712 Unilateral primary osteoarthritis, left knee: Secondary | ICD-10-CM | POA: Diagnosis not present

## 2020-03-03 DIAGNOSIS — Z79899 Other long term (current) drug therapy: Secondary | ICD-10-CM | POA: Insufficient documentation

## 2020-03-03 DIAGNOSIS — R2242 Localized swelling, mass and lump, left lower limb: Secondary | ICD-10-CM | POA: Diagnosis not present

## 2020-03-03 HISTORY — DX: Pure hypercholesterolemia, unspecified: E78.00

## 2020-03-03 IMAGING — DX DG TIBIA/FIBULA 2V*L*
2 series · 2 of 2 positions shown · non-contrast
Comparison: Left knee radiographs [DATE]

CLINICAL DATA: Lower extremity pain and swelling

EXAM:
LEFT TIBIA AND FIBULA - 2 VIEW

[tibia ap]
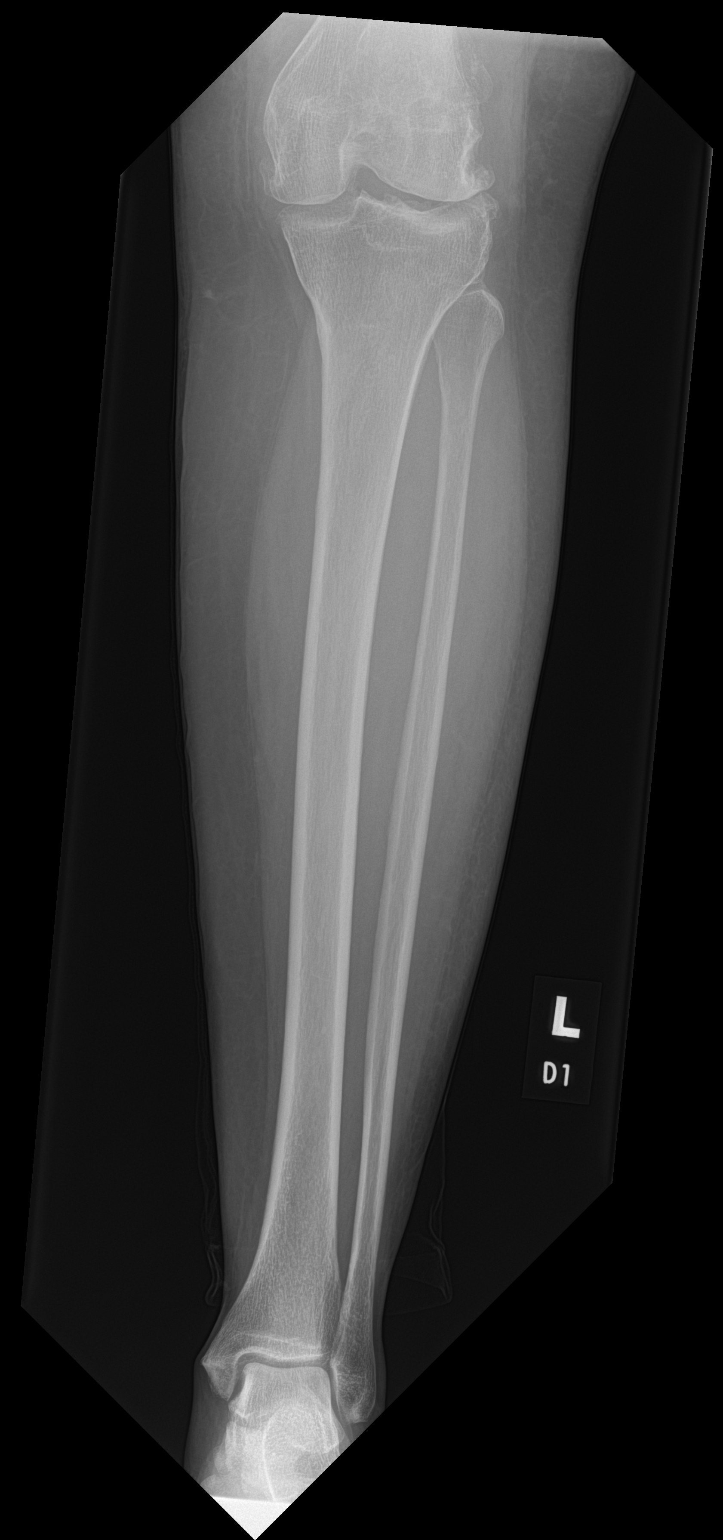

[tibia lat]
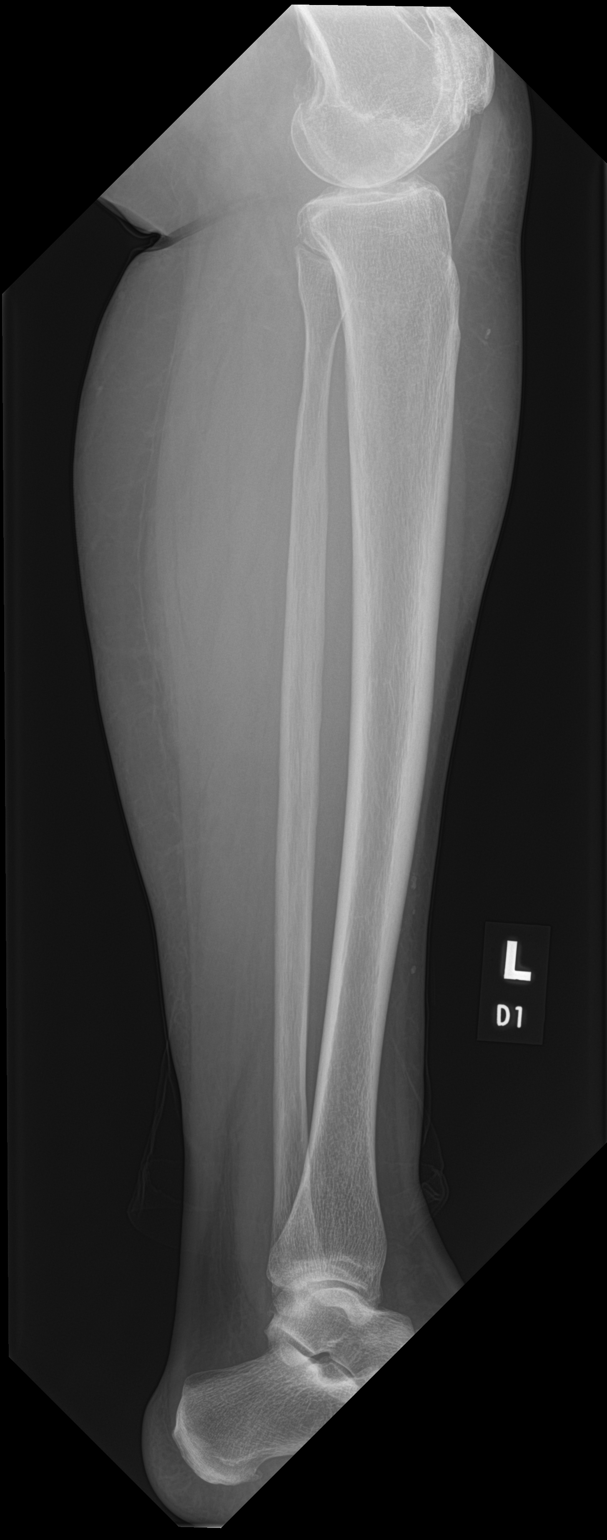

[2 of 2 positions shown; findings below may reference images not displayed]

FINDINGS: Frontal and lateral views were obtained. No fracture or dislocation.
No abnormal periosteal reaction. There is extensive osteoarthritic
change in the knee joint region. Narrowing is most severe in the
patellofemoral joint.
IMPRESSION: Extensive osteoarthritic change in the knee joint. No fracture or
dislocation.

## 2020-03-03 MED ORDER — METHYLPREDNISOLONE 4 MG PO TBPK: ORAL_TABLET | ORAL | 0 refills | Status: DC

## 2020-03-03 MED ORDER — KETOROLAC TROMETHAMINE 60 MG/2ML IM SOLN
60.0000 mg | Freq: Once | INTRAMUSCULAR | Status: AC
  Administered 2020-03-03: 60 mg via INTRAMUSCULAR
  Filled 2020-03-03: qty 2

## 2020-03-03 NOTE — ED Notes (Signed)
Review D/C papers with pt, reviewed Rx with pt, pt states understanding, pt denies questions at this time. 

## 2020-03-03 NOTE — ED Provider Notes (Addendum)
[facility] EMERGENCY DEPARTMENT Provider Note   CSN: [id number] Arrival date & time: 03/03/20  1503     History Chief Complaint  Patient presents with  . Leg Pain    Vickie Mclean is a 49 y.o. female.  HPI 49 year old female with history of hypertension, hypercholesteremia presents to the ER with plaints of left leg swelling that will radiate into her hips when she lays down.  Patient states that she has noticed that her left lower shin has been more swollen than normal.  States that she has been using topical hydrocortisone cream, Voltaren gel, Tylenol/ibuprofen with little relief.  States the pain will shoot up into her left hip when she lays down.  She is concerned for a blood clot.  Denies any numbness or tingling.  Denies any chest pain or shortness of breath.  No prior history of DVTs.  Nuys any new injuries or falls.  States that she has had swelling to her right knee for which she had fluid drained and had some steroid injections.  Denies any fevers or chills.  Denies any numbness or tingling, no loss of bowel bladder control.  No foot drop.  States that ambulating has been a little bit painful.    Past Medical History:  Diagnosis Date  . High cholesterol   . Hypertension     There are no problems to display for this patient.   Past Surgical History:  Procedure Laterality Date  . HERNIA REPAIR       OB History    Gravida  4   Para      Term      Preterm      AB  3   Living        SAB      TAB      Ectopic      Multiple      Live Births              No family history on file.  Social History   Tobacco Use  . Smoking status: Never Smoker  . Smokeless tobacco: Never Used  Substance Use Topics  . Alcohol use: No  . Drug use: No    Home Medications Prior to Admission medications   Medication Sig Start Date End Date Taking? Authorizing Provider  escitalopram (LEXAPRO) 10 MG tablet Take 10 mg by mouth every morning. 12/18/14    [provider]  HYDROcodone-acetaminophen (NORCO/VICODIN) 5-325 MG tablet Take 1-2 tablets by mouth every 6 (six) hours as needed. 04/25/15   Ward, Layla Maw, DO  lidocaine (LIDODERM) 5 % Place 1 patch onto the skin daily. Remove & Discard patch within 12 hours or as directed by MD 04/25/15   Ward, Layla Maw, DO  LINZESS 290 MCG CAPS capsule Take 290 mcg by mouth daily. 10/17/19   [provider]  lisinopril (PRINIVIL,ZESTRIL) 20 MG tablet Take 20 mg by mouth daily.    [provider]  meloxicam (MOBIC) 15 MG tablet Take 1 tablet (15 mg total) by mouth daily. 04/25/15   Ward, Layla Maw, DO  methylPREDNISolone (MEDROL DOSEPAK) 4 MG TBPK tablet Take as directed until finished 03/03/20   Trudee Grip A, PA-C  ondansetron (ZOFRAN ODT) 4 MG disintegrating tablet Take 1 tablet (4 mg total) by mouth every 8 (eight) hours as needed for nausea or vomiting. 04/25/15   Ward, Layla Maw, DO  topiramate (TOPAMAX) 100 MG tablet  03/03/20   [provider]  Allergies    Patient has no known allergies.  Review of Systems   Review of Systems  Constitutional: Negative for fever.  Cardiovascular: Positive for leg swelling. Negative for chest pain.  Musculoskeletal: Positive for joint swelling. Negative for back pain and gait problem.  Skin: Negative for color change, pallor and wound.    Physical Exam Updated Vital Signs BP 140/79 (BP Location: Right Arm)   Pulse 74   Temp 98.4 F (36.9 C) (Oral)   Resp 16   Ht 5\' 1"  (1.549 m)   Wt 74.8 kg   SpO2 100%   BMI 31.18 kg/m   Physical Exam Vitals and nursing note reviewed.  Constitutional:      General: She is not in acute distress.    Appearance: She is well-developed. She is not ill-appearing or diaphoretic.  HENT:     Head: Normocephalic and atraumatic.  Eyes:     Conjunctiva/sclera: Conjunctivae normal.  Cardiovascular:     Rate and Rhythm: Normal rate and regular rhythm.     Pulses: Normal pulses.      Heart sounds: Normal heart sounds. No murmur heard.   Pulmonary:     Effort: Pulmonary effort is normal. No respiratory distress.     Breath sounds: Normal breath sounds.  Abdominal:     General: Abdomen is flat.     Palpations: Abdomen is soft.     Tenderness: There is no abdominal tenderness.  Musculoskeletal:        General: Swelling present. No tenderness, deformity or signs of injury.     Cervical back: Normal range of motion and neck supple.     Right lower leg: No edema.     Left lower leg: No edema.     Comments: Left lower anterior shin with trace swelling as compared to the right.  No significant erythema or warmth.  No calf tenderness on exam.  5/5 strength in the left knee with flexion and extension.  No significant swelling noted to the left knee.Compartments soft in both LE. No midline tenderness to C,T, LSPINE.  No pitting edema noted.  2+ DP pulses bilaterally.  Patient ambulated in the ED without difficulty.  Full range of motion and 5/5 strength with flexion and extension of the left hip.  Skin:    General: Skin is warm and dry.     Capillary Refill: Capillary refill takes less than 2 seconds.     Findings: No bruising, erythema or rash.  Neurological:     General: No focal deficit present.     Mental Status: She is alert and oriented to person, place, and time.     Sensory: No sensory deficit.     Motor: No weakness.  Psychiatric:        Mood and Affect: Mood normal.        Behavior: Behavior normal.     ED Results / Procedures / Treatments   Labs (all labs ordered are listed, but only abnormal results are displayed) Labs Reviewed - No data to display  EKG None  Radiology DG Tibia/Fibula Left  Result Date: 03/03/2020 CLINICAL DATA:  Lower extremity pain and swelling EXAM: LEFT TIBIA AND FIBULA - 2 VIEW COMPARISON:  Left knee radiographs September 06, 2011 FINDINGS: Frontal and lateral views were obtained. No fracture or dislocation. No abnormal periosteal  reaction. There is extensive osteoarthritic change in the knee joint region. Narrowing is most severe in the patellofemoral joint. IMPRESSION: Extensive osteoarthritic change in the knee joint. No  fracture or dislocation. Electronically Signed   By: Bretta Bang III M.D.   On: 03/03/2020 17:54    Procedures Procedures (including critical care time)  Medications Ordered in ED Medications  ketorolac (TORADOL) injection 60 mg (60 mg Intramuscular Given 03/03/20 1710)    ED Course  I have reviewed the triage vital signs and the nursing notes.  Pertinent labs & imaging results that were available during my care of the patient were reviewed by me and considered in my medical decision making (see chart for details).    MDM Rules/Calculators/A&P                         49 year old female with complaints of left leg swelling and pain rating it up into her left hip.  On arrival, her vitals are overall reassuring, not tachycardic, tachypneic or hypoxic.  Afebrile.  Physical exam with trace swelling to her left lower shin, no significant erythema, warmth, or tenderness noted.  She has full range of motion of the left knee and hip.  No signs of cauda equina.  Plan to obtain plain films of the left tib/fib and left knee.  No ultrasound here at this time at [facility], will provide ambulatory referral.  Toradol provided here in the ED.  Plain films with extensive OA of the knee joint but no acute abnormalities. Low suspicion for septic joint, compartments are soft, low suspicion for compartment syndrome. No midline tenderness to the [facility] noted. Will provide Medrol Dosepak taper.  Encouraged alternating between Tylenol and ibuprofen. She already has a prescription for Voltaren gel, and encouraged her to use this as well. Ambulatory referral for ultrasound provided.  Encouraged follow-up with orthopedic doctor if this is normal.  She voiced understanding and is agreeable. Return precautions  discussed. At this stage in the ED course, the patient is medically screened and stable for discharge.  Final Clinical Impression(s) / ED Diagnoses Final diagnoses:  Left leg pain    Rx / DC Orders ED Discharge Orders         Ordered    US Venous Img Lower Unilateral Left        03/03/20 1722    methylPREDNISolone (MEDROL DOSEPAK) 4 MG TBPK tablet        03/03/20 1757              Mare Ferrari, PA-C 03/03/20 1805    Virgina Norfolk, DO 03/03/20 2249

## 2020-03-03 NOTE — ED Triage Notes (Signed)
Left leg pain from her leg to her left hip for 6 days.  Denies injury.

## 2020-03-03 NOTE — Discharge Instructions (Signed)
Your x-rays here today showed that you have pretty significant arthritis in the left knee. This could be causing the swelling in your left lower leg. We do not have ultrasound here at [facility] at this time, however please follow the instructions on your ambulatory referral and return to the ER to have an ultrasound of your left leg done tomorrow. I have called in some steroids for you to help with the inflammation. Please continue to alternate Tylenol or ibuprofen for pain. You may continue to use Voltaren gel topically. Please take this as directed until finished. I have also provided a referral to orthopedics. If your ultrasound is normal, please follow-up with them. Return to the ER for any new or worsening symptoms.

## 2020-03-04 ENCOUNTER — Ambulatory Visit (HOSPITAL_BASED_OUTPATIENT_CLINIC_OR_DEPARTMENT_OTHER)
Admission: RE | Admit: 2020-03-04 | Discharge: 2020-03-04 | Disposition: A | Discharge status: Home / Self Care | Payer: BC Managed Care – PPO | Source: Ambulatory Visit | Attending: Emergency Medicine | Admitting: Emergency Medicine

## 2020-03-04 DIAGNOSIS — M7989 Other specified soft tissue disorders: Secondary | ICD-10-CM | POA: Diagnosis not present

## 2020-03-04 DIAGNOSIS — M79605 Pain in left leg: Secondary | ICD-10-CM | POA: Insufficient documentation

## 2020-03-04 DIAGNOSIS — M7122 Synovial cyst of popliteal space [Baker], left knee: Secondary | ICD-10-CM | POA: Diagnosis not present

## 2020-03-04 IMAGING — US US EXTREM LOW VENOUS*L*
1 series · 14 of 24 positions shown · non-contrast
Comparison: None.

CLINICAL DATA: Left leg pain for 6 days.

EXAM:
LEFT LOWER EXTREMITY VENOUS DOPPLER ULTRASOUND
TECHNIQUE: Gray-scale sonography with compression, as well as color and duplex
ultrasound, were performed to evaluate the deep venous system(s)
from the level of the common femoral vein through the popliteal and
proximal calf veins.

[Series 1: us extrem low venous*left* · 14 of 35 slices shown]
[im 1/35]
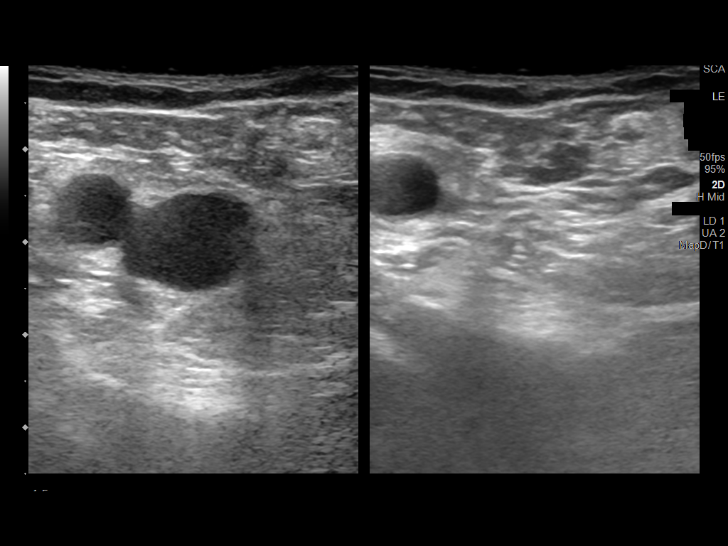
[im 3/35]
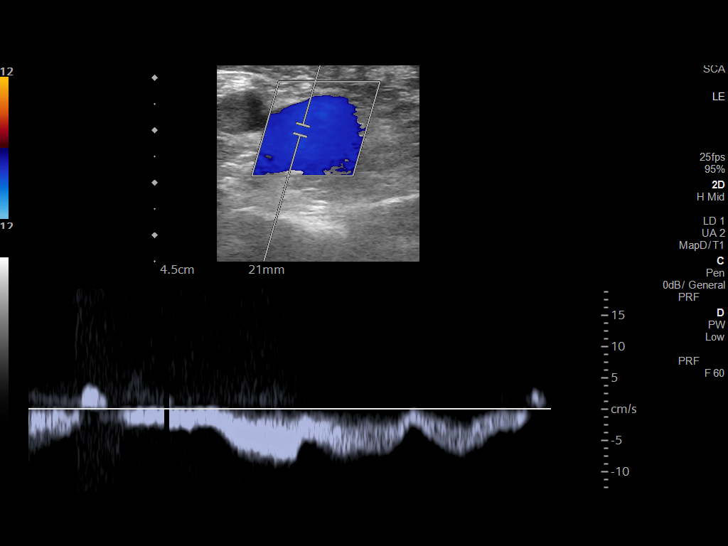
[im 6/35]
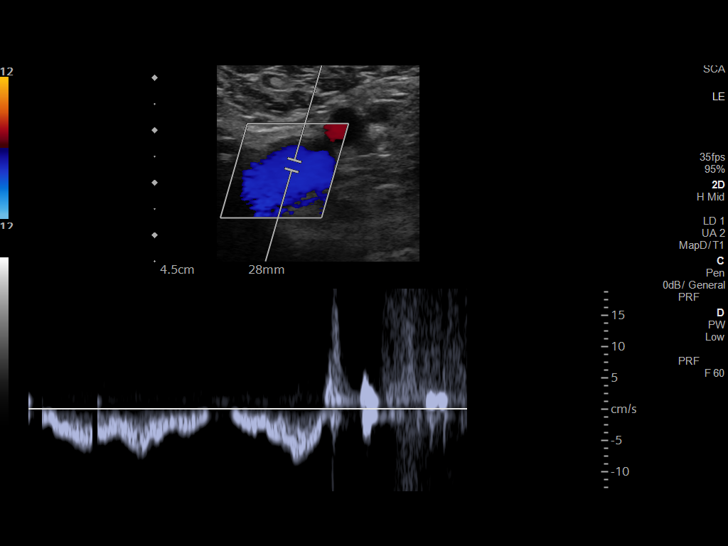
[im 9/35]
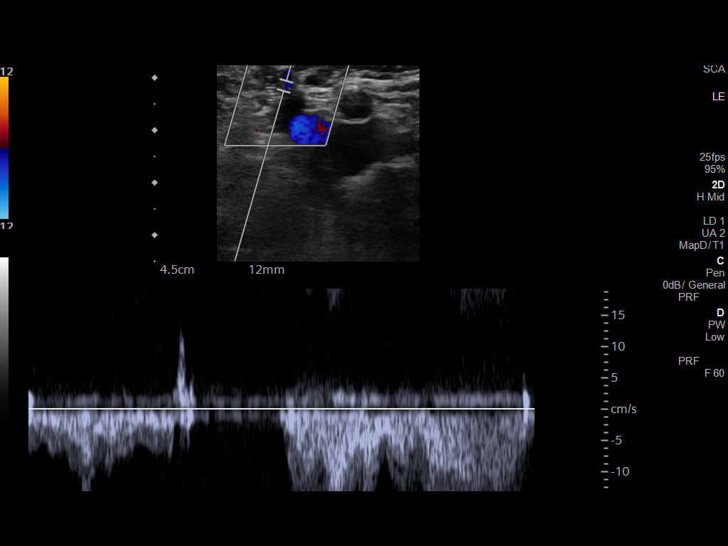
[im 11/35]
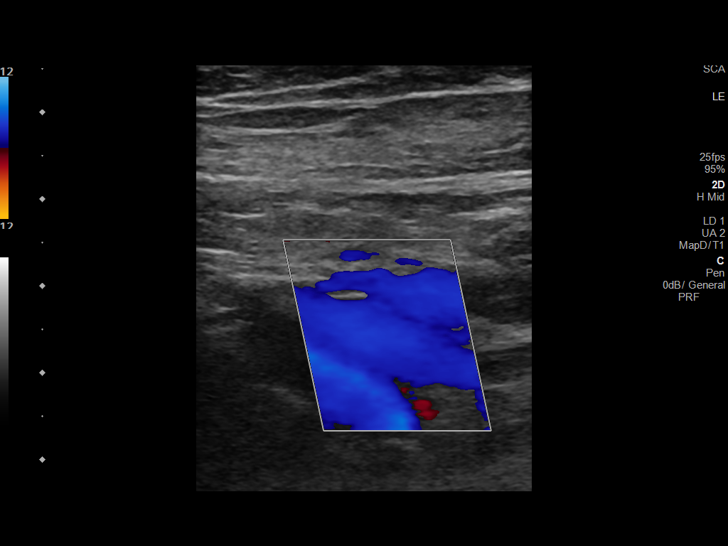
[im 14/35]
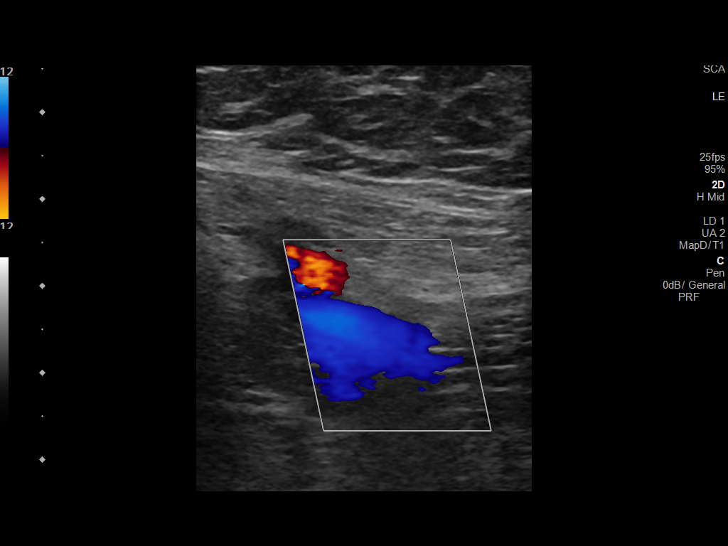
[im 17/35]
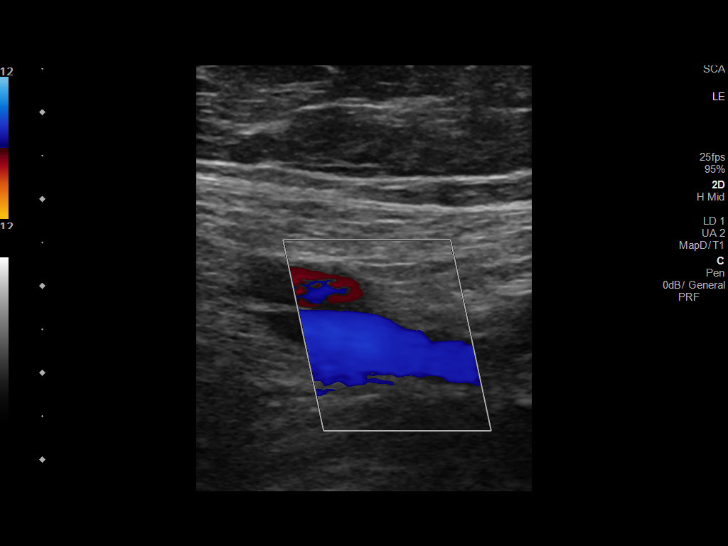
[im 18/35]
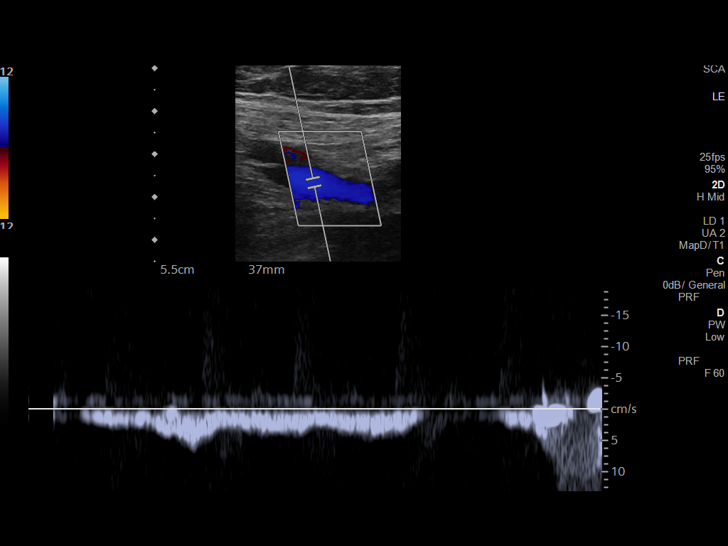
[im 21/35]
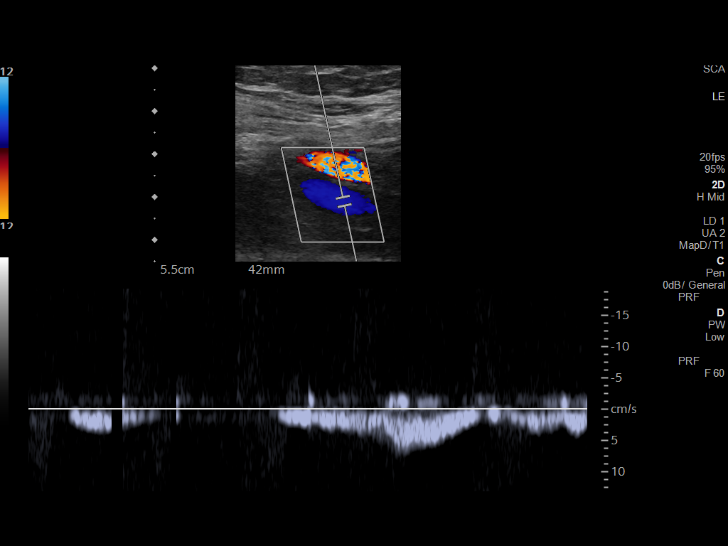
[im 24/35]
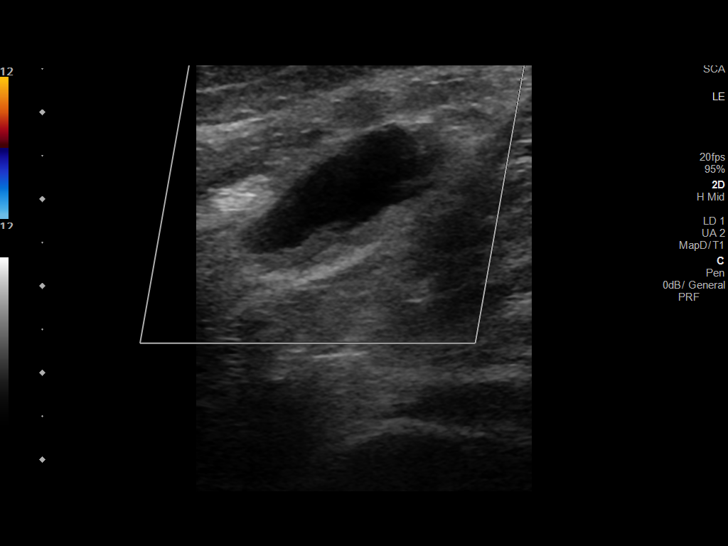
[im 27/35]
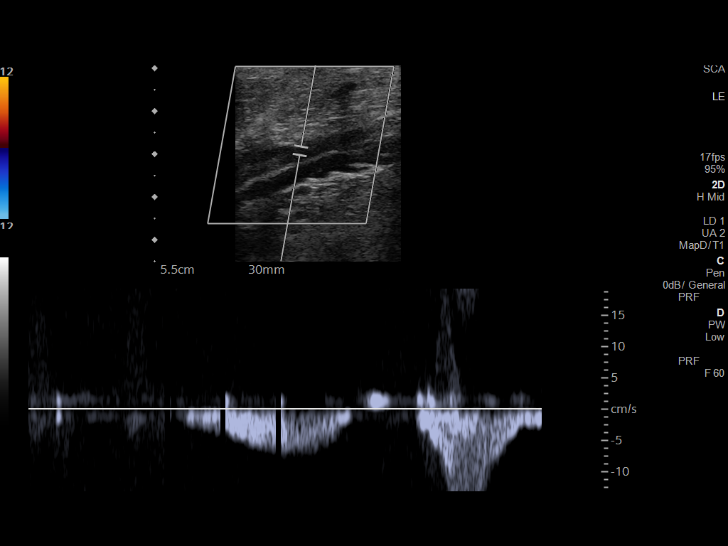
[im 29/35]
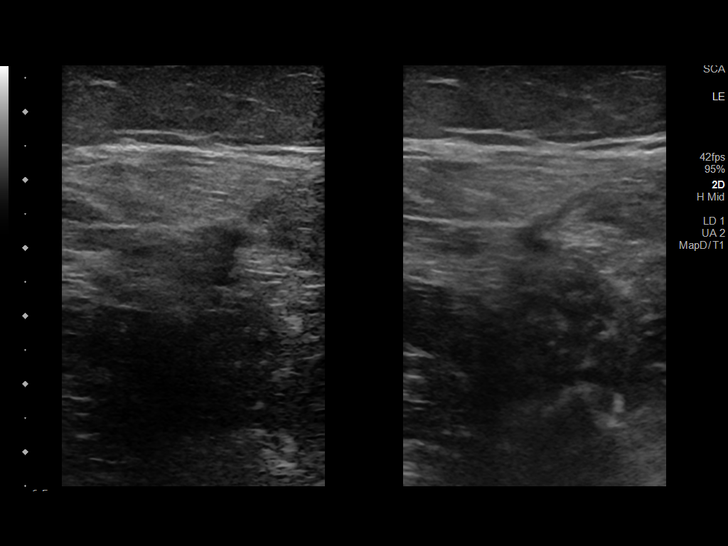
[im 32/35]
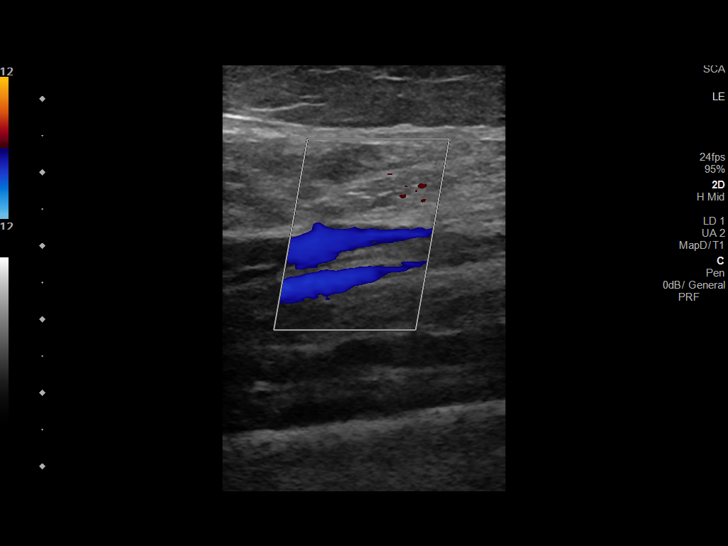
[im 35/35]
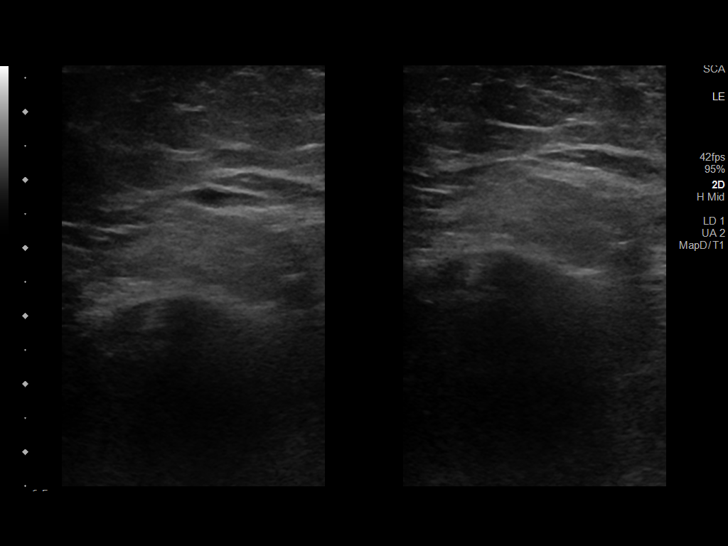

[14 of 24 positions shown; findings below may reference images not displayed]

FINDINGS: VENOUS

Normal compressibility of the common femoral, superficial femoral,
and popliteal veins, as well as the visualized calf veins.
Visualized portions of profunda femoral vein and great saphenous
vein unremarkable. No filling defects to suggest DVT on grayscale or
color Doppler imaging. Doppler waveforms show normal direction of
venous flow, normal respiratory plasticity and response to
augmentation.

Limited views of the contralateral common femoral vein are
unremarkable.

OTHER

There is a fluid collection in the popliteal fossa consistent with a
Baker's cyst measuring 2.4 cm craniocaudal by up to 1 cm in
diameter.

Limitations: none
IMPRESSION: Negative for DVT.

Small Baker's cyst.

## 2020-03-23 ENCOUNTER — Emergency Department (HOSPITAL_BASED_OUTPATIENT_CLINIC_OR_DEPARTMENT_OTHER): Payer: BC Managed Care – PPO

## 2020-03-23 ENCOUNTER — Emergency Department (HOSPITAL_BASED_OUTPATIENT_CLINIC_OR_DEPARTMENT_OTHER)
Admission: EM | Admit: 2020-03-23 | Discharge: 2020-03-23 | Disposition: A | Discharge status: Home / Self Care | Payer: BC Managed Care – PPO | Attending: Emergency Medicine | Admitting: Emergency Medicine

## 2020-03-23 ENCOUNTER — Encounter (HOSPITAL_BASED_OUTPATIENT_CLINIC_OR_DEPARTMENT_OTHER): Payer: Self-pay | Admitting: Emergency Medicine

## 2020-03-23 ENCOUNTER — Other Ambulatory Visit: Payer: Self-pay

## 2020-03-23 DIAGNOSIS — I10 Essential (primary) hypertension: Secondary | ICD-10-CM | POA: Insufficient documentation

## 2020-03-23 DIAGNOSIS — Y9301 Activity, walking, marching and hiking: Secondary | ICD-10-CM | POA: Insufficient documentation

## 2020-03-23 DIAGNOSIS — S8012XA Contusion of left lower leg, initial encounter: Secondary | ICD-10-CM

## 2020-03-23 DIAGNOSIS — Y92513 Shop (commercial) as the place of occurrence of the external cause: Secondary | ICD-10-CM | POA: Diagnosis not present

## 2020-03-23 DIAGNOSIS — M19012 Primary osteoarthritis, left shoulder: Secondary | ICD-10-CM | POA: Diagnosis not present

## 2020-03-23 DIAGNOSIS — S4992XA Unspecified injury of left shoulder and upper arm, initial encounter: Secondary | ICD-10-CM | POA: Diagnosis not present

## 2020-03-23 DIAGNOSIS — W19XXXA Unspecified fall, initial encounter: Secondary | ICD-10-CM

## 2020-03-23 DIAGNOSIS — S40012A Contusion of left shoulder, initial encounter: Secondary | ICD-10-CM | POA: Insufficient documentation

## 2020-03-23 DIAGNOSIS — M1712 Unilateral primary osteoarthritis, left knee: Secondary | ICD-10-CM | POA: Diagnosis not present

## 2020-03-23 DIAGNOSIS — S8992XA Unspecified injury of left lower leg, initial encounter: Secondary | ICD-10-CM | POA: Diagnosis not present

## 2020-03-23 DIAGNOSIS — Z79899 Other long term (current) drug therapy: Secondary | ICD-10-CM | POA: Diagnosis not present

## 2020-03-23 DIAGNOSIS — W010XXA Fall on same level from slipping, tripping and stumbling without subsequent striking against object, initial encounter: Secondary | ICD-10-CM | POA: Insufficient documentation

## 2020-03-23 IMAGING — DX DG HUMERUS 2V *L*
2 series · 2 of 2 positions shown · non-contrast
Comparison: None.

CLINICAL DATA: Left shoulder pain.

EXAM:
LEFT HUMERUS - 2+ VIEW; LEFT SHOULDER - 2+ VIEW

[humerus ap]
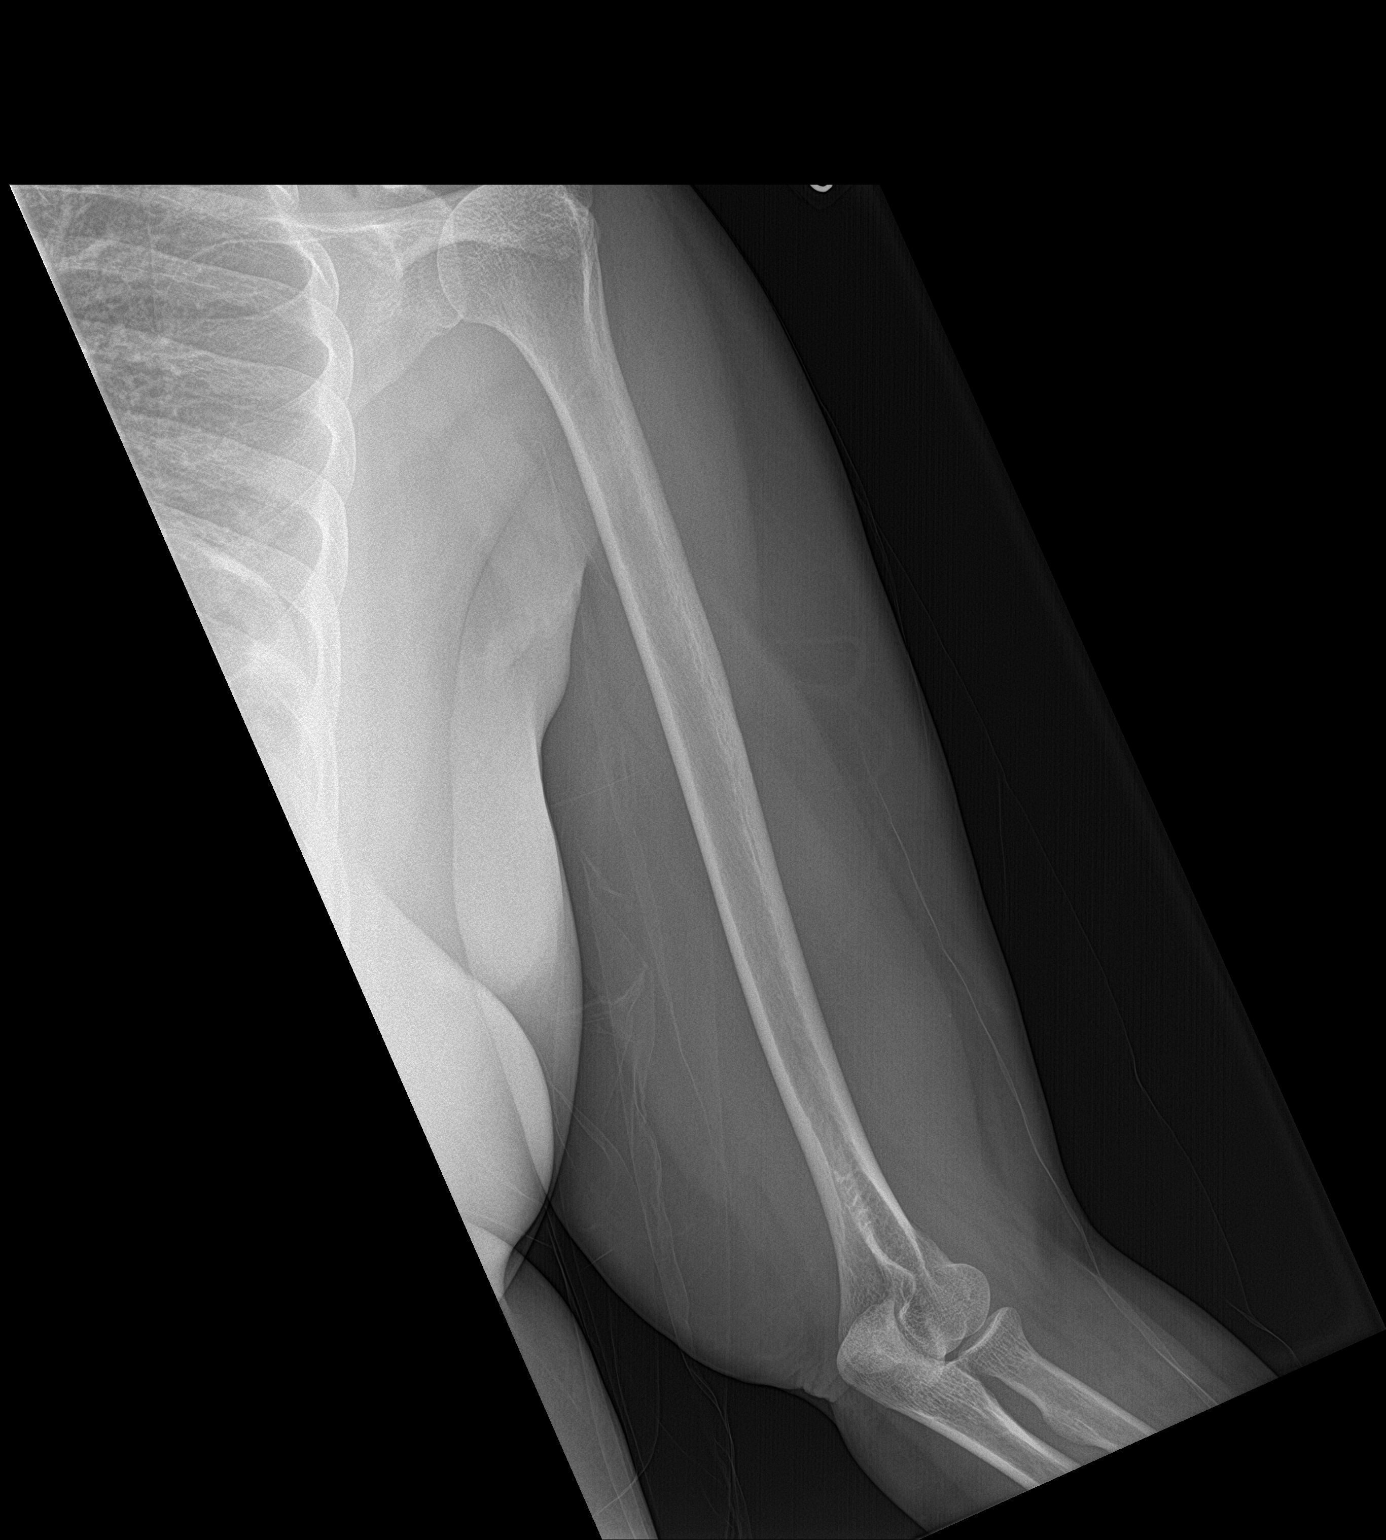

[humerus lat]
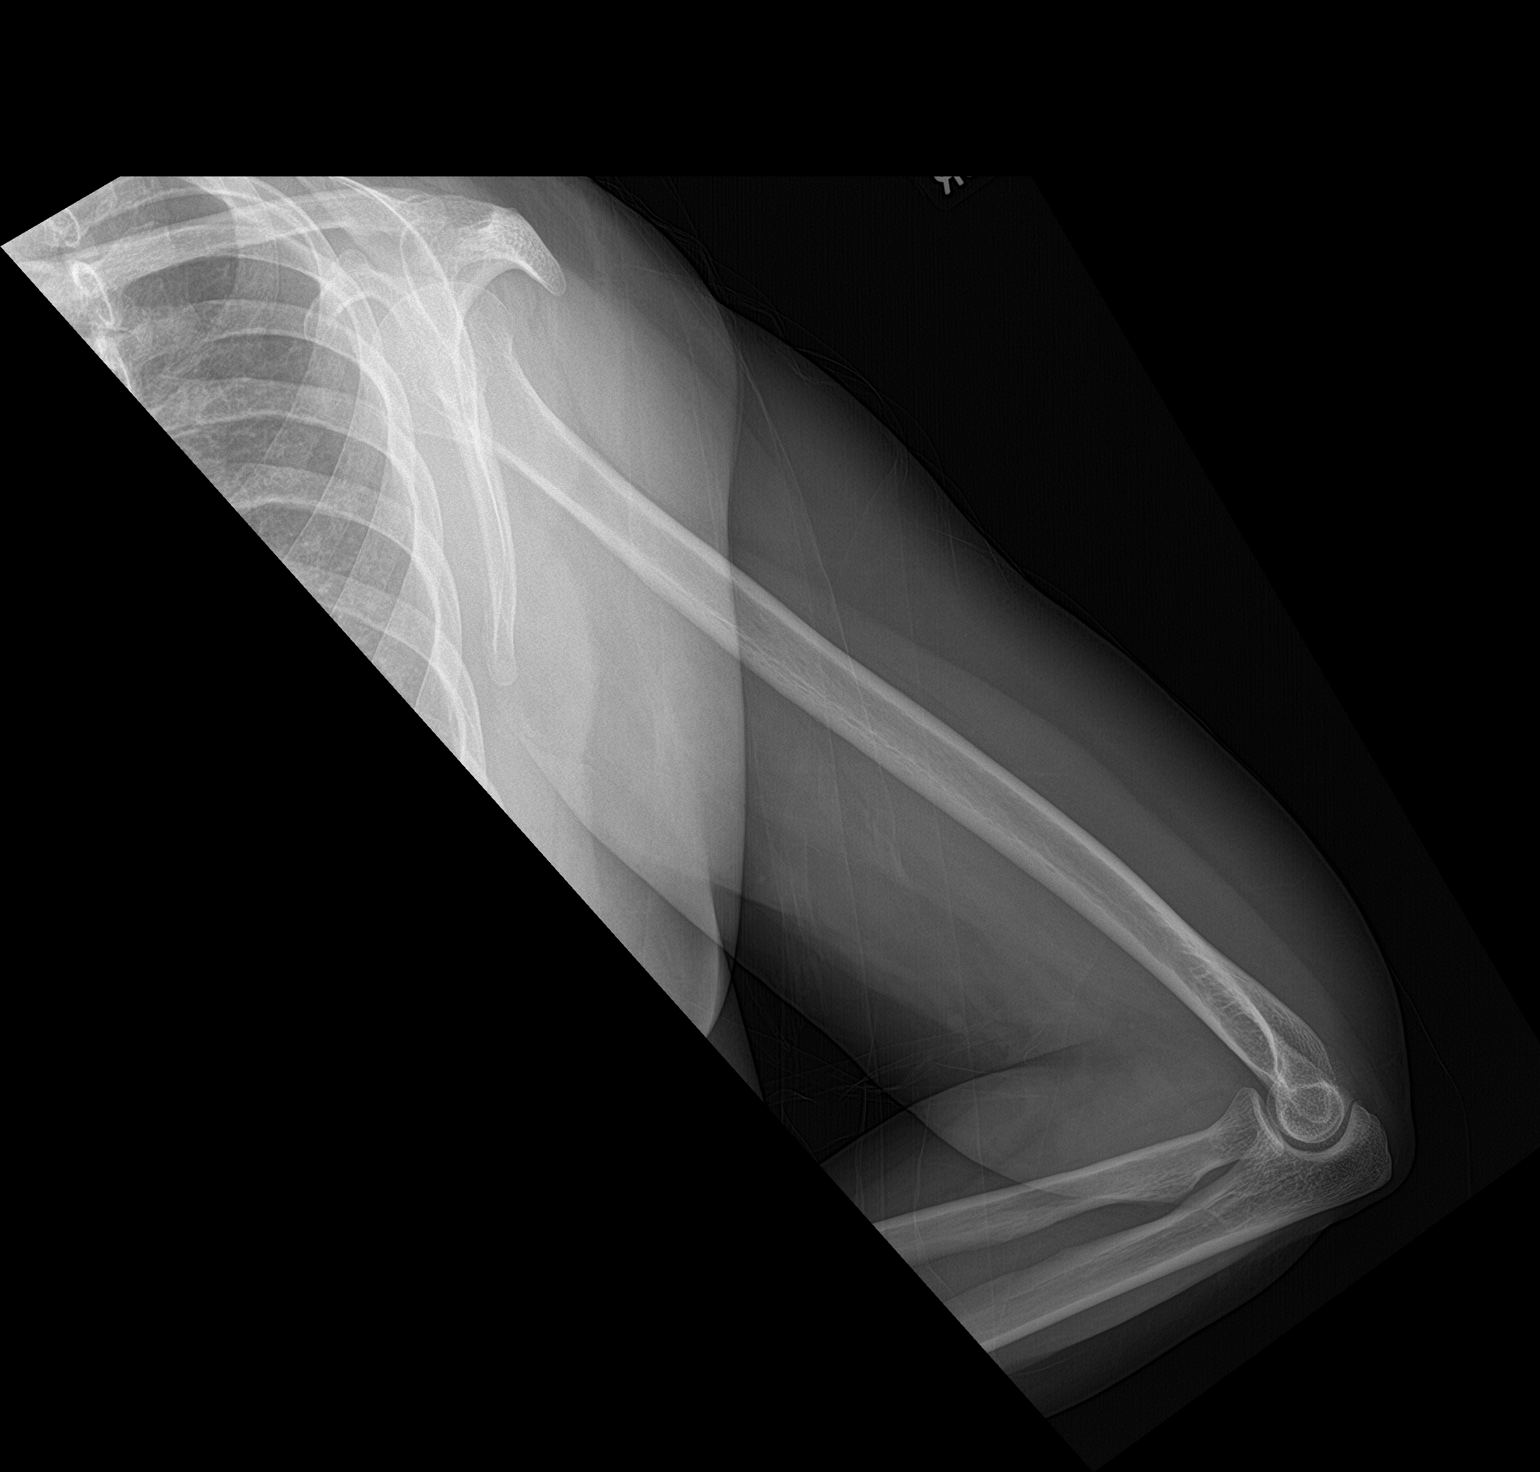

[2 of 2 positions shown; findings below may reference images not displayed]

FINDINGS: There is no acute displaced fracture or dislocation of the left
shoulder. Minimal degenerative changes are noted of the left
glenohumeral joint. There is no acute displaced fracture involving
the left humerus.
IMPRESSION: No acute displaced fracture or dislocation of the left shoulder or
left humerus.

## 2020-03-23 IMAGING — DX DG SHOULDER 2+V*L*
3 series · 3 of 3 positions shown · non-contrast
Comparison: None.

CLINICAL DATA: Left shoulder pain.

EXAM:
LEFT HUMERUS - 2+ VIEW; LEFT SHOULDER - 2+ VIEW

[shoulder grashey]
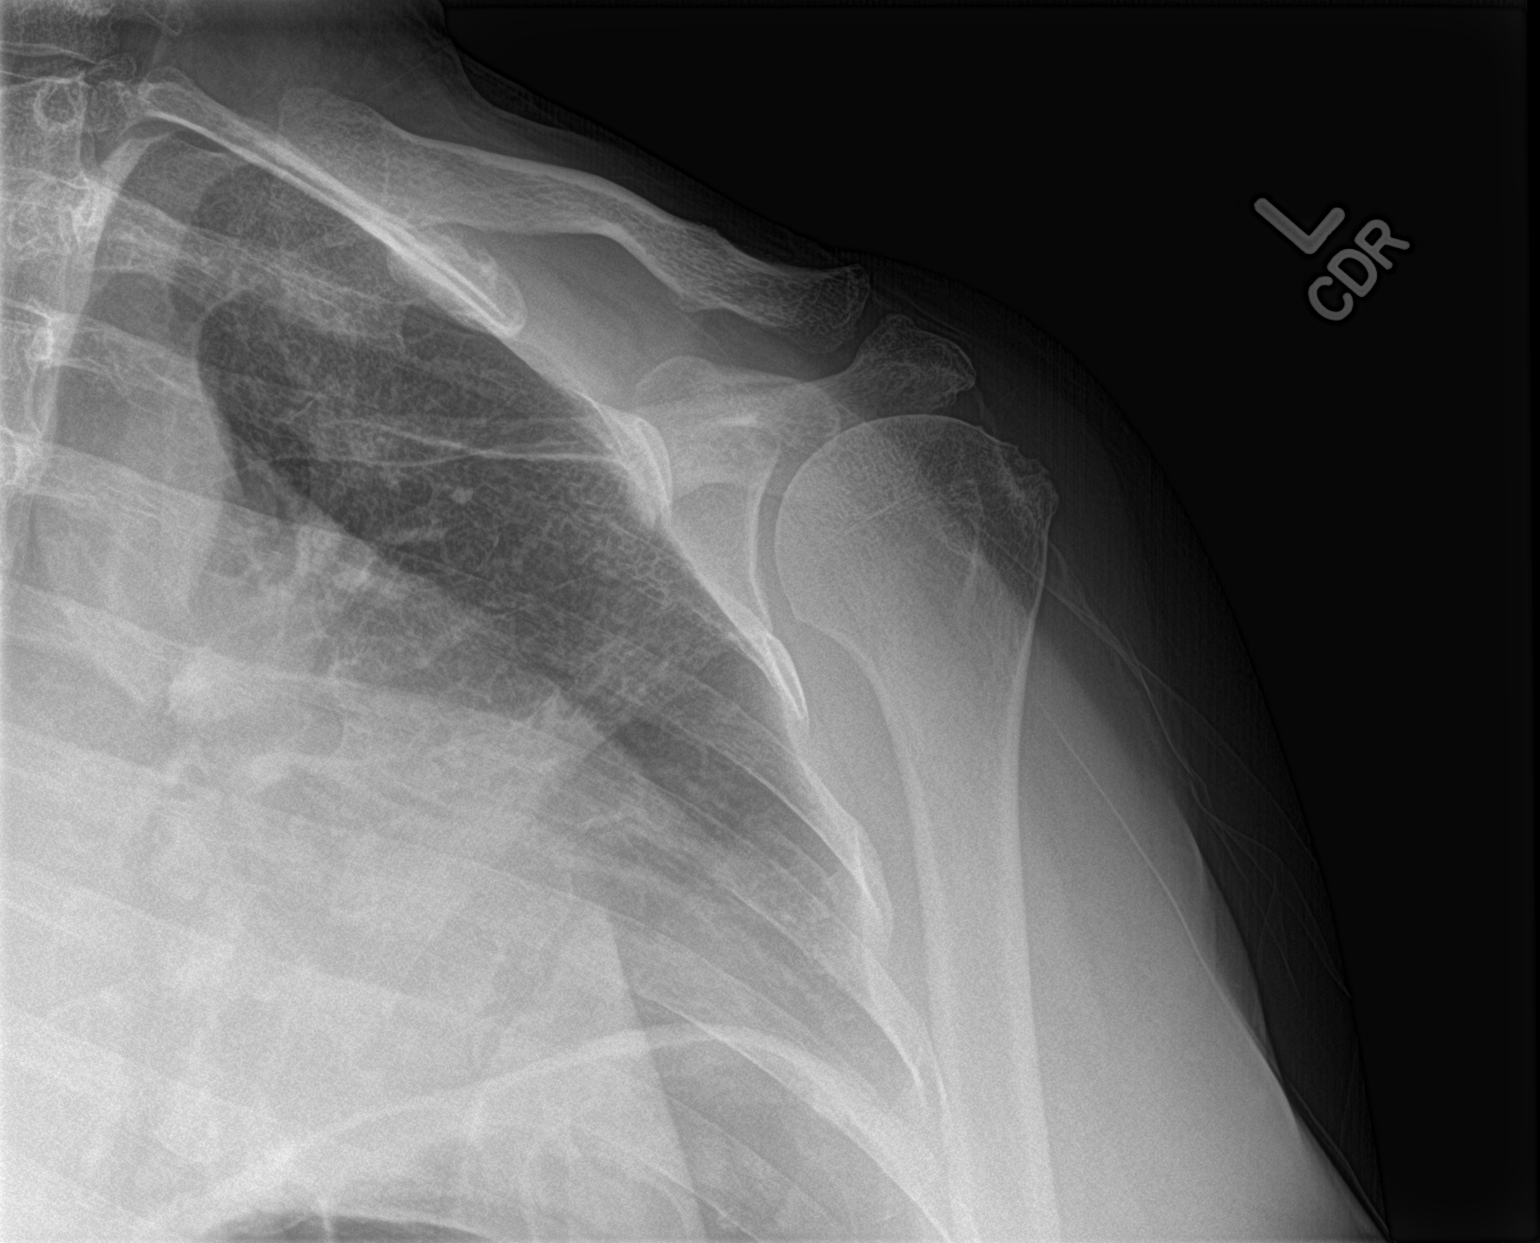

[shoulder axillary]
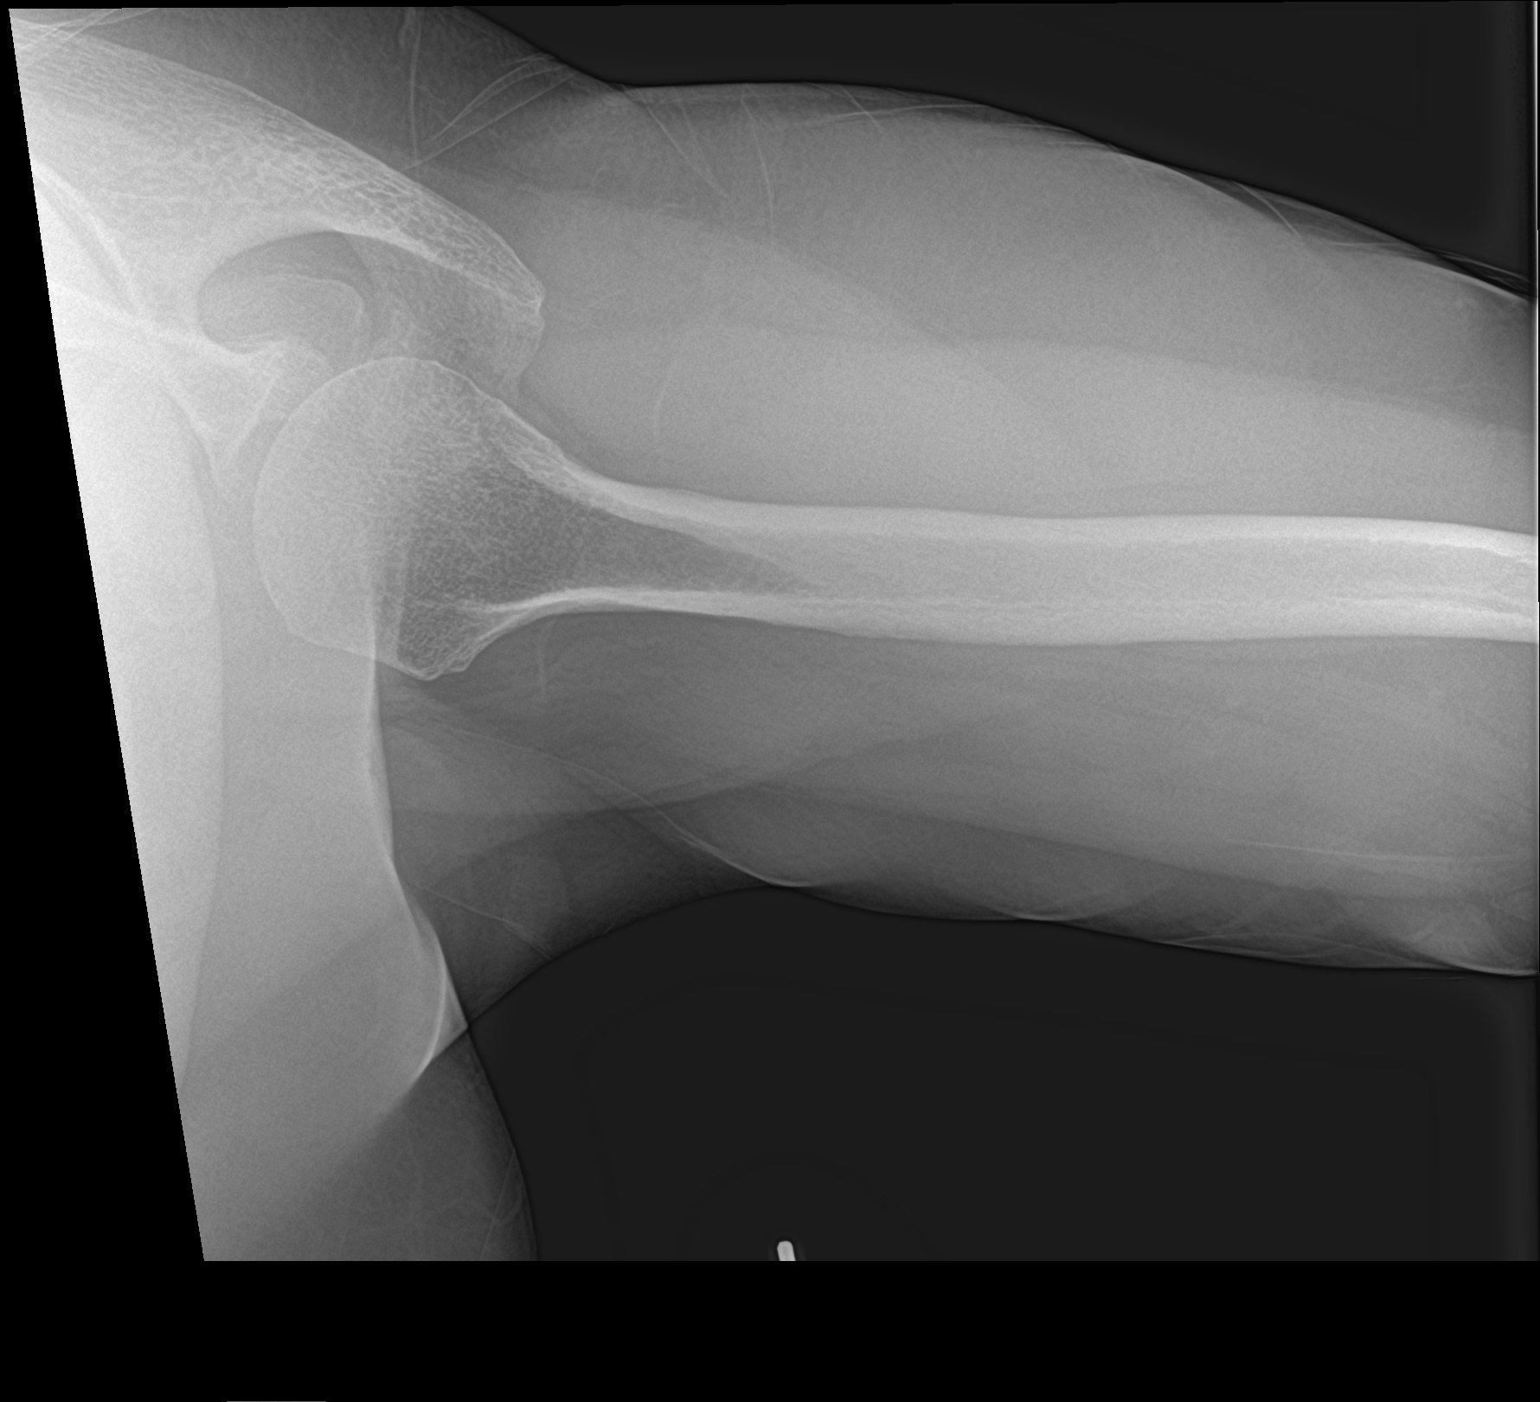

[shoulder y view]
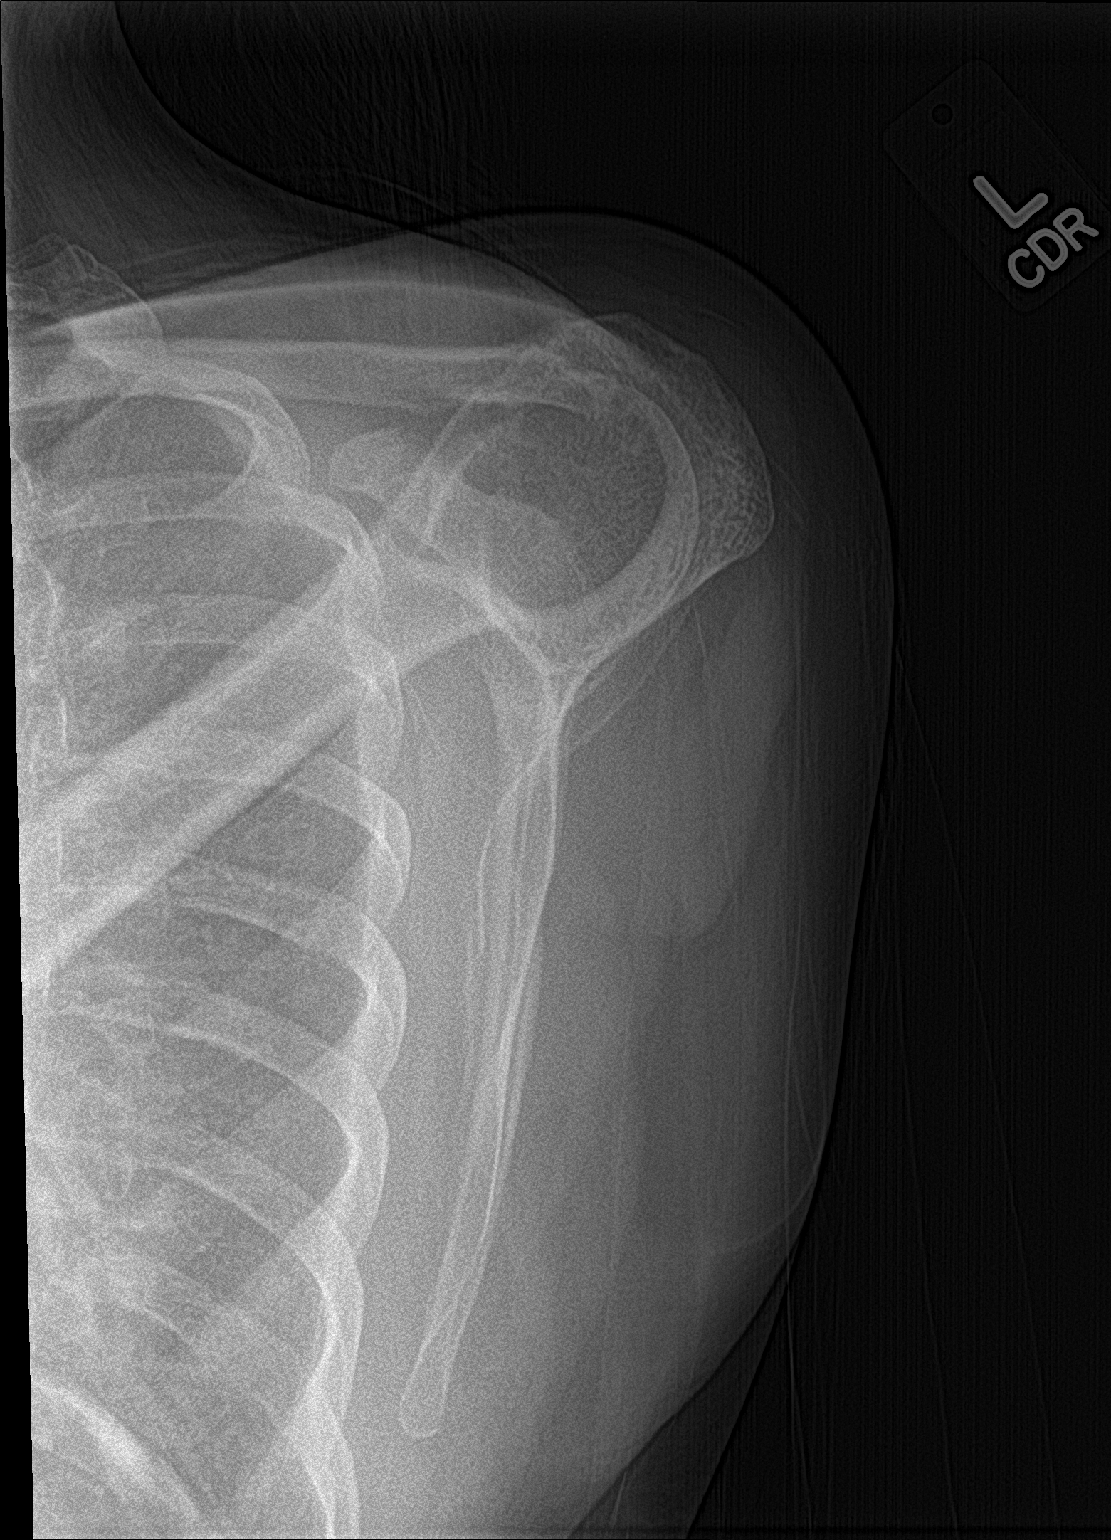

[3 of 3 positions shown; findings below may reference images not displayed]

FINDINGS: There is no acute displaced fracture or dislocation of the left
shoulder. Minimal degenerative changes are noted of the left
glenohumeral joint. There is no acute displaced fracture involving
the left humerus.
IMPRESSION: No acute displaced fracture or dislocation of the left shoulder or
left humerus.

## 2020-03-23 IMAGING — DX DG TIBIA/FIBULA 2V*L*
4 series · 4 of 4 positions shown · non-contrast
Comparison: None.

CLINICAL DATA: Initial evaluation for acute trauma, fall,
left-sided pain.

EXAM:
LEFT TIBIA AND FIBULA - 2 VIEW

[tibia ap (1 of 2)]
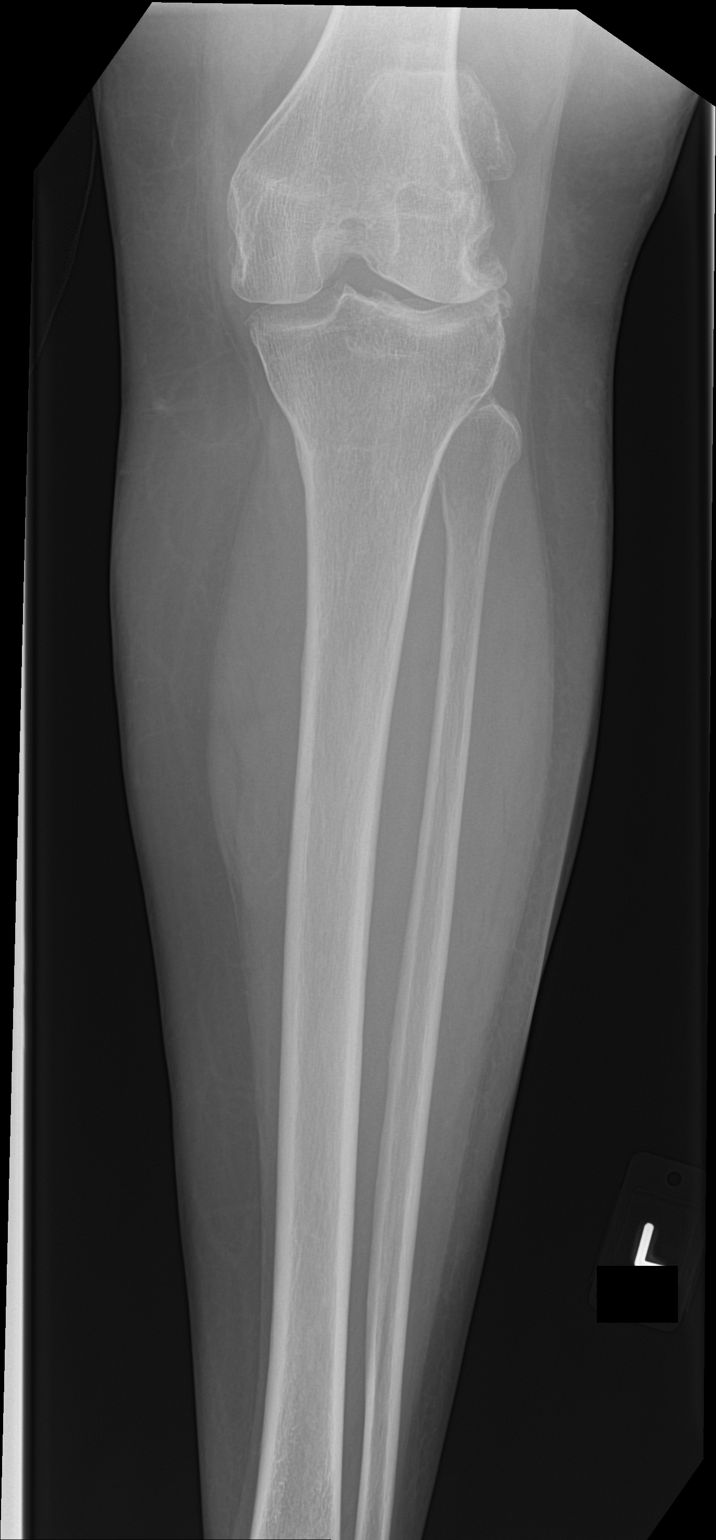

[tibia ap (2 of 2)]
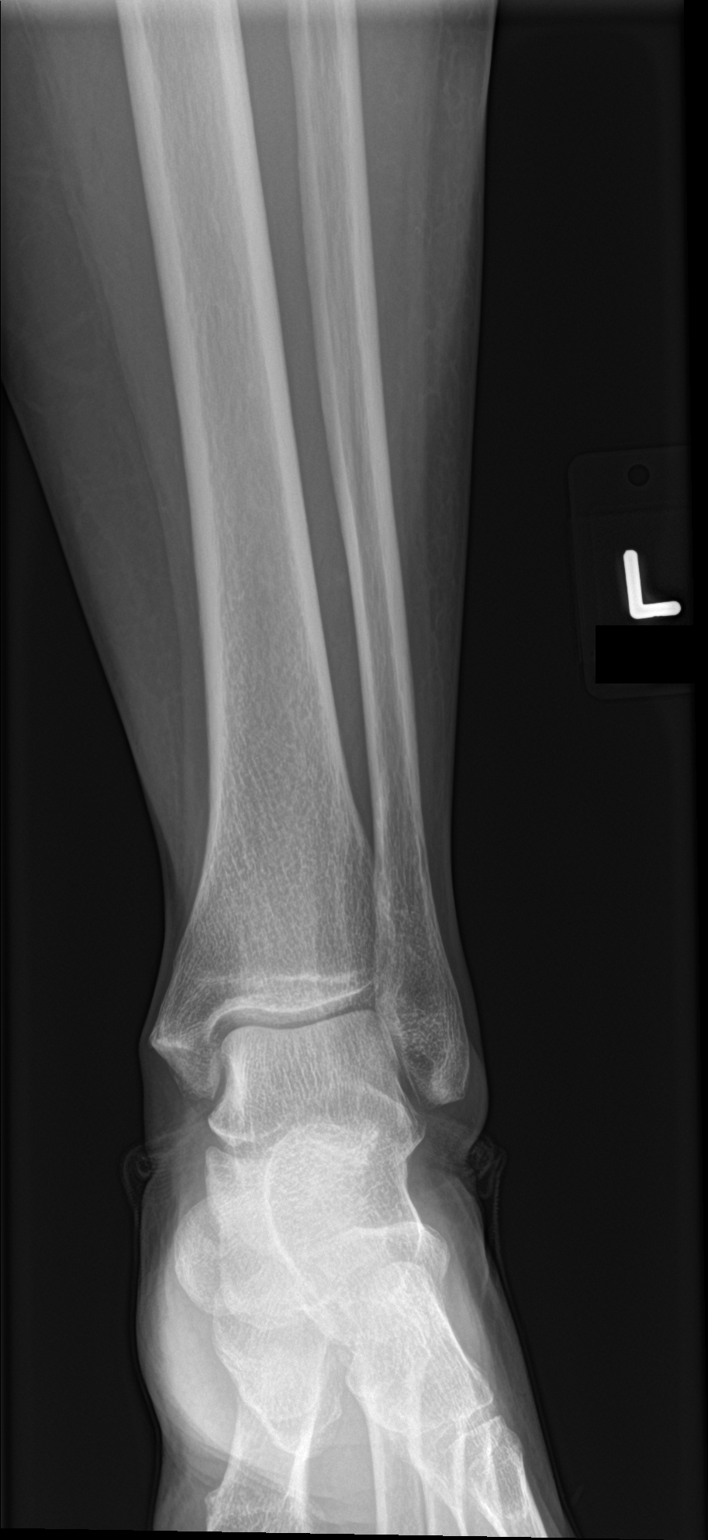

[tibia lat (1 of 2)]
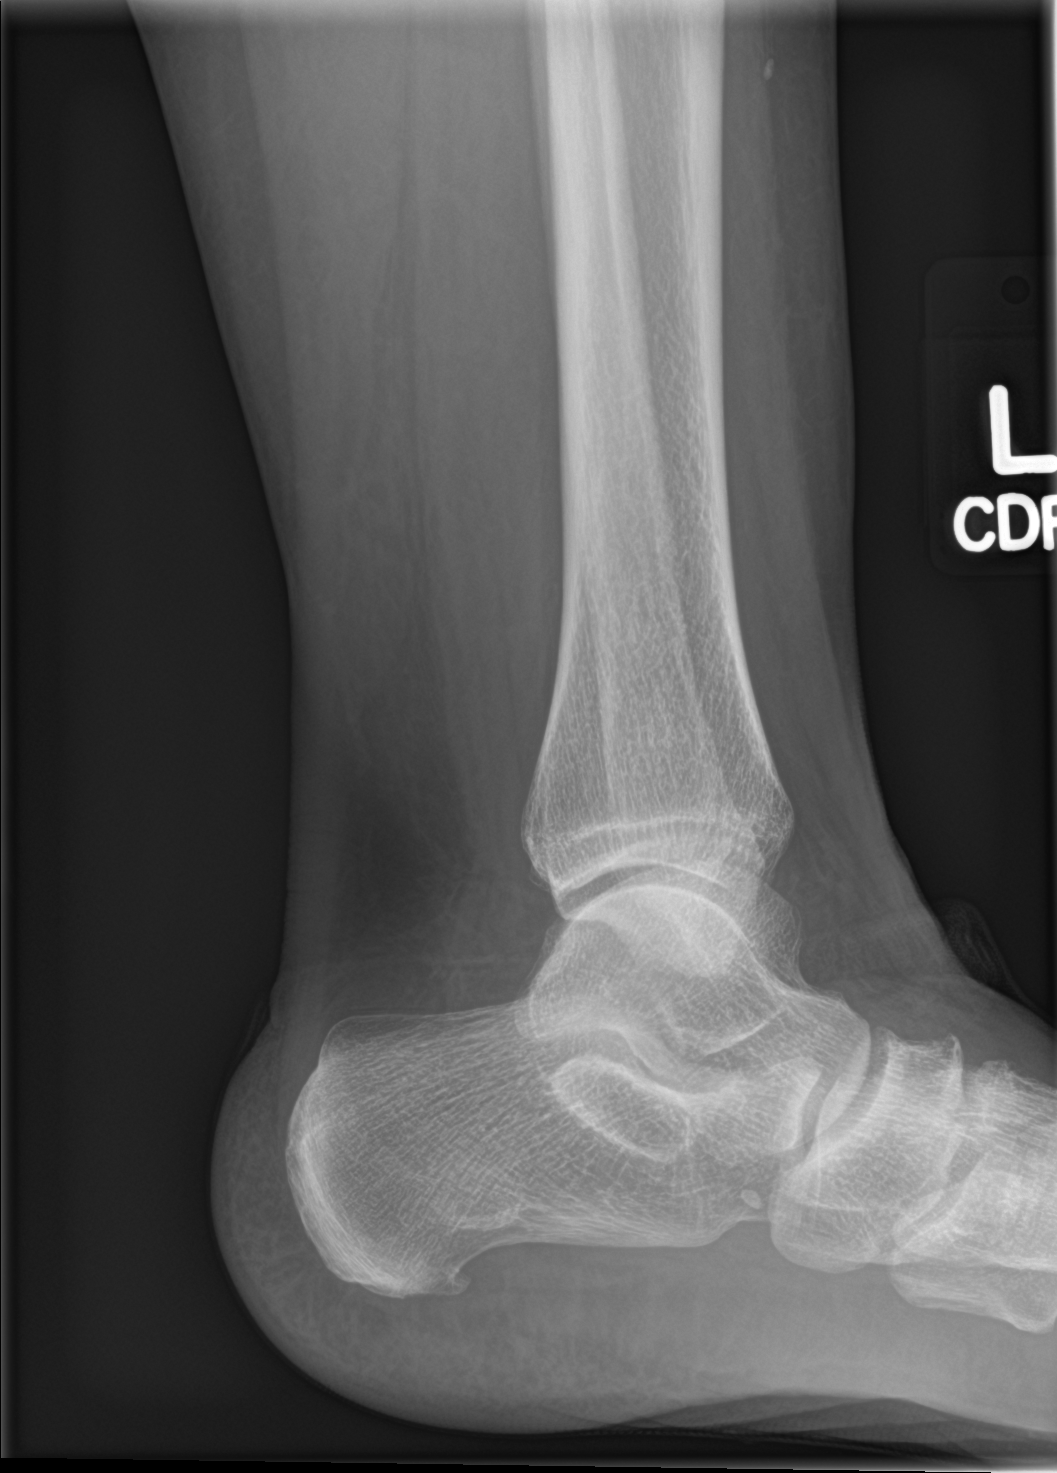

[tibia lat (2 of 2)]
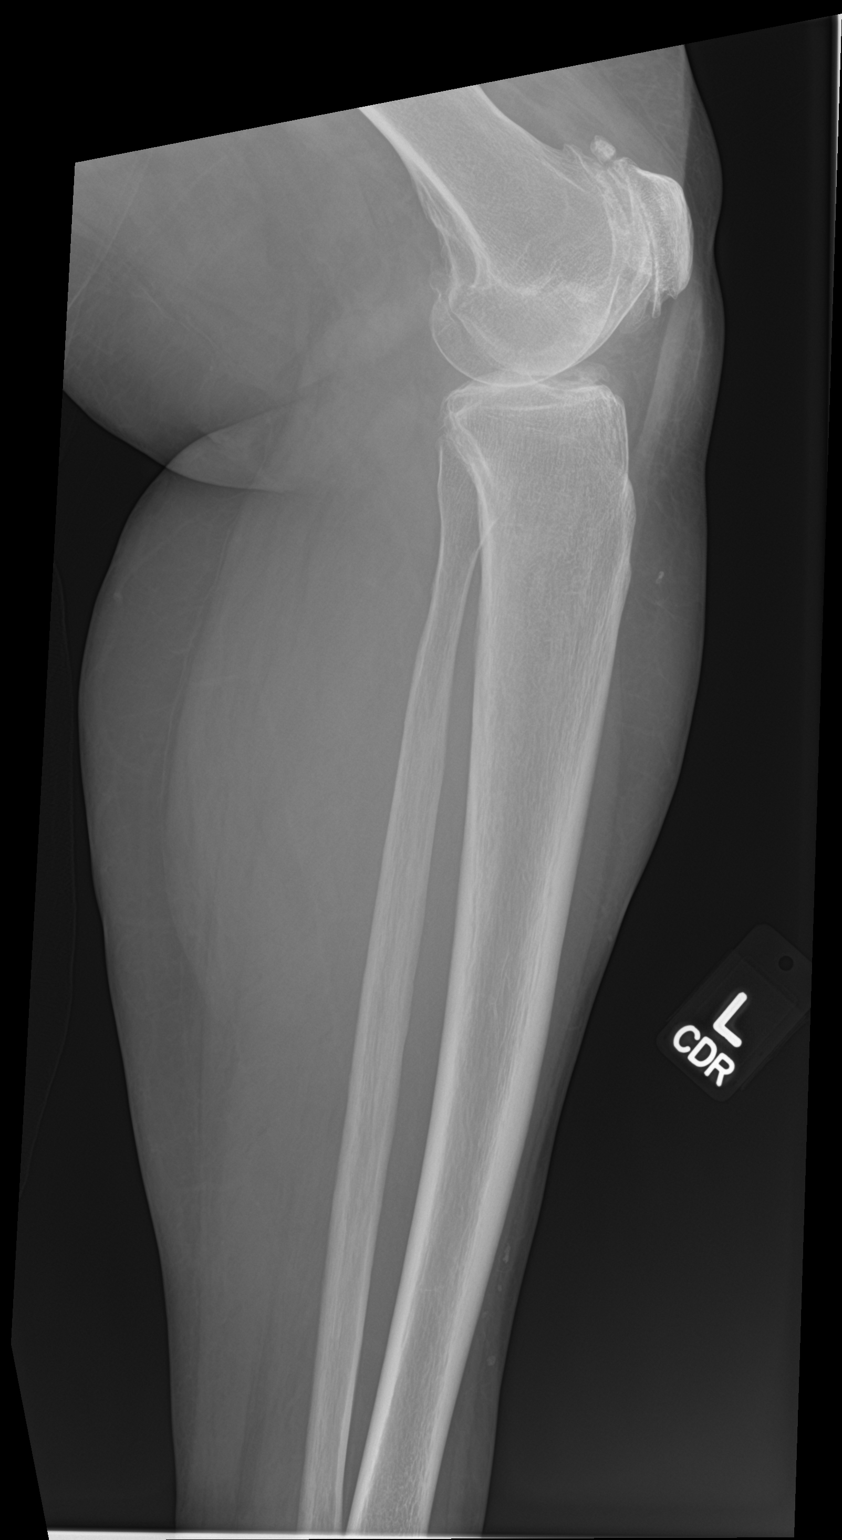

[4 of 4 positions shown; findings below may reference images not displayed]

FINDINGS: No acute fracture or dislocation. Moderate osteoarthritic changes
noted about the visualized knee. Plantar calcaneal enthesophyte
noted. Osseous mineralization normal. No visible soft tissue injury.
IMPRESSION: 1. No acute osseous abnormality about the left tibia/fibula.
2. Moderate degenerative osteoarthrosis about the left knee.

## 2020-03-23 MED ORDER — MELOXICAM 15 MG PO TABS: 15.0000 mg | ORAL_TABLET | Freq: Every day | ORAL | 0 refills | Status: DC

## 2020-03-23 NOTE — ED Provider Notes (Signed)
[facility] EMERGENCY DEPARTMENT Provider Note   CSN: [id number] Arrival date & time: 03/23/20  1802     History Chief Complaint  Patient presents with  . Fall    Vickie Mclean is a 49 y.o. female.  Patient is a 49 year old female with a history of hypertension and hyperlipidemia who presents with left side pain after a fall. She says that yesterday she was walking out of a nail salon and tripped, falling onto her left side. She complains of pain mostly to her left shoulder and left arm as well as her left lower leg. She denies any numbness or weakness in the legs. She denies any head injury. No neck or back pain. No rib pain or shortness of breath. No abdominal pain. She is not on anticoagulants.        Past Medical History:  Diagnosis Date  . High cholesterol   . Hypertension     There are no problems to display for this patient.   Past Surgical History:  Procedure Laterality Date  . HERNIA REPAIR       OB History    Gravida  4   Para      Term      Preterm      AB  3   Living        SAB      IAB      Ectopic      Multiple      Live Births              No family history on file.  Social History   Tobacco Use  . Smoking status: Never Smoker  . Smokeless tobacco: Never Used  Substance Use Topics  . Alcohol use: No  . Drug use: No    Home Medications Prior to Admission medications   Medication Sig Start Date End Date Taking? Authorizing Provider  escitalopram (LEXAPRO) 10 MG tablet Take 10 mg by mouth every morning. 12/18/14   [provider]  FLUoxetine (PROZAC) 40 MG capsule Take 40 mg by mouth daily. 03/03/20   [provider]  HYDROcodone-acetaminophen (NORCO/VICODIN) 5-325 MG tablet Take 1-2 tablets by mouth every 6 (six) hours as needed. 04/25/15   Ward, Layla Maw, DO  lidocaine (LIDODERM) 5 % Place 1 patch onto the skin daily. Remove & Discard patch within 12 hours or as directed by MD 04/25/15    Ward, Layla Maw, DO  LINZESS 290 MCG CAPS capsule Take 290 mcg by mouth daily. 10/17/19   [provider]  lisinopril (PRINIVIL,ZESTRIL) 20 MG tablet Take 20 mg by mouth daily.    [provider]  lisinopril (ZESTRIL) 40 MG tablet Take 40 mg by mouth daily. 03/03/20   [provider]  meloxicam (MOBIC) 15 MG tablet Take 1 tablet (15 mg total) by mouth daily. 03/23/20   Rolan Bucco, MD  methylPREDNISolone (MEDROL DOSEPAK) 4 MG TBPK tablet Take as directed until finished 03/03/20   Trudee Grip A, PA-C  ondansetron (ZOFRAN ODT) 4 MG disintegrating tablet Take 1 tablet (4 mg total) by mouth every 8 (eight) hours as needed for nausea or vomiting. 04/25/15   Ward, Layla Maw, DO  topiramate (TOPAMAX) 100 MG tablet  03/03/20   [provider]    Allergies    Patient has no known allergies.  Review of Systems   Review of Systems  Constitutional: Negative for fever.  Gastrointestinal: Negative for nausea and vomiting.  Musculoskeletal: Positive for arthralgias  and joint swelling. Negative for back pain and neck pain.  Skin: Negative for wound.  Neurological: Negative for weakness, numbness and headaches.    Physical Exam Updated Vital Signs BP (!) 112/48 (BP Location: Right Arm)   Pulse 71   Temp 98.4 F (36.9 C) (Oral)   Resp 20   Ht 5\' 1"  (1.549 m)   Wt 72.6 kg   SpO2 99%   BMI 30.23 kg/m   Physical Exam Constitutional:      Appearance: She is well-developed and well-nourished.  HENT:     Head: Normocephalic and atraumatic.  Neck:     Comments: No pain along the cervical, thoracic or lumbosacral spine Cardiovascular:     Rate and Rhythm: Normal rate.  Pulmonary:     Effort: Pulmonary effort is normal.     Comments: No pain to the ribs Chest:     Chest wall: No tenderness.  Musculoskeletal:        General: Tenderness and edema present.     Cervical back: Normal range of motion and neck supple.     Comments: Positive tenderness to the  left shoulder, she has pain in the anterior and posterior shoulder, she has pain on range of motion of the shoulder. She also has pain to the mid humerus. No deformity is noted. There is no pain to the elbow or lower arm. Radial pulses are intact. She has normal sensation and motor function distally. She also has pain in her left tib-fib. She has some minor swelling over the anterior lower leg. There is no pain to the ankle. She has some tenderness to the knee but she says that is chronic and she does not feel like it is any different or injured during the fall. She has no pain to the hip. Pedal pulses are intact. She has normal sensation and motor function distally.  Skin:    General: Skin is warm and dry.  Neurological:     Mental Status: She is alert and oriented to person, place, and time.  Psychiatric:        Mood and Affect: Mood and affect normal.     ED Results / Procedures / Treatments   Labs (all labs ordered are listed, but only abnormal results are displayed) Labs Reviewed - No data to display  EKG None  Radiology DG Tibia/Fibula Left  Result Date: 03/23/2020 CLINICAL DATA:  Initial evaluation for acute trauma, fall, left-sided pain. EXAM: LEFT TIBIA AND FIBULA - 2 VIEW COMPARISON:  None. FINDINGS: No acute fracture or dislocation. Moderate osteoarthritic changes noted about the visualized knee. Plantar calcaneal enthesophyte noted. Osseous mineralization normal. No visible soft tissue injury. IMPRESSION: 1. No acute osseous abnormality about the left tibia/fibula. 2. 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 Electronically Signed   By: 03/25/2020 M.D.   On: 03/23/2020 21:55   DG Shoulder Left  Result Date: 03/23/2020 CLINICAL DATA:  Left shoulder pain. EXAM: LEFT HUMERUS - 2+ VIEW; LEFT SHOULDER - 2+ VIEW COMPARISON:  None. FINDINGS: There is no acute displaced fracture or dislocation of the left shoulder. Minimal degenerative changes are noted of the  left glenohumeral joint. There is no acute displaced fracture involving the left humerus. IMPRESSION: No acute displaced fracture or dislocation of the left shoulder or left humerus. Electronically Signed   By: 03/25/2020 M.D.   On: 03/23/2020 21:57   DG Humerus Left  Result Date: 03/23/2020 CLINICAL DATA:  Left shoulder pain. EXAM: LEFT HUMERUS - 2+  VIEW; LEFT SHOULDER - 2+ VIEW COMPARISON:  None. FINDINGS: There is no acute displaced fracture or dislocation of the left shoulder. Minimal degenerative changes are noted of the left glenohumeral joint. There is no acute displaced fracture involving the left humerus. IMPRESSION: No acute displaced fracture or dislocation of the left shoulder or left humerus. Electronically Signed   By: Katherine Mantle M.D.   On: 03/23/2020 21:57    Procedures Procedures (including critical care time)  Medications Ordered in ED Medications - No data to display  ED Course  I have reviewed the triage vital signs and the nursing notes.  Pertinent labs & imaging results that were available during my care of the patient were reviewed by me and considered in my medical decision making (see chart for details).    MDM Rules/Calculators/A&P                          Patient is a 49 year old female who presents after a fall.  She had no apparent head injury.  She has no pain along her spine.  She has pain across her left arm and shoulder and left lower leg.  X-rays of these areas reveal no evidence of acute fracture.  These were reviewed by me.  She is neurologically intact.  She was discharged home in good condition.  She was given a refill on her Mobic that she had previously used.  She was encouraged to follow-up with her primary care provider if her symptoms are not improving.  Return precautions were given. Final Clinical Impression(s) / ED Diagnoses Final diagnoses:  Fall, initial encounter  Contusion of left shoulder, initial encounter  Contusion of  left lower leg, initial encounter    Rx / DC Orders ED Discharge Orders         Ordered    meloxicam (MOBIC) 15 MG tablet  Daily        03/23/20 2209           Rolan Bucco, MD 03/23/20 2211

## 2020-03-23 NOTE — ED Triage Notes (Signed)
Reports slipping up at nail salon yesterday falling on left side.  Having pain on that whole side.  Hx of sciatica in the same side.

## 2020-03-26 ENCOUNTER — Other Ambulatory Visit: Payer: Self-pay

## 2020-03-26 ENCOUNTER — Ambulatory Visit: Payer: Self-pay

## 2020-03-26 ENCOUNTER — Ambulatory Visit: Payer: BC Managed Care – PPO | Admitting: Family Medicine

## 2020-03-26 VITALS — BP 118/86 | Ht 65.0 in | Wt 165.0 lb

## 2020-03-26 DIAGNOSIS — M84453A Pathological fracture, unspecified femur, initial encounter for fracture: Secondary | ICD-10-CM | POA: Insufficient documentation

## 2020-03-26 DIAGNOSIS — S83289A Other tear of lateral meniscus, current injury, unspecified knee, initial encounter: Secondary | ICD-10-CM | POA: Insufficient documentation

## 2020-03-26 DIAGNOSIS — M75102 Unspecified rotator cuff tear or rupture of left shoulder, not specified as traumatic: Secondary | ICD-10-CM | POA: Insufficient documentation

## 2020-03-26 DIAGNOSIS — M25512 Pain in left shoulder: Secondary | ICD-10-CM

## 2020-03-26 DIAGNOSIS — S8002XA Contusion of left knee, initial encounter: Secondary | ICD-10-CM | POA: Diagnosis not present

## 2020-03-26 DIAGNOSIS — S4992XD Unspecified injury of left shoulder and upper arm, subsequent encounter: Secondary | ICD-10-CM | POA: Insufficient documentation

## 2020-03-26 DIAGNOSIS — S43439A Superior glenoid labrum lesion of unspecified shoulder, initial encounter: Secondary | ICD-10-CM | POA: Insufficient documentation

## 2020-03-26 DIAGNOSIS — M25562 Pain in left knee: Secondary | ICD-10-CM

## 2020-03-26 MED ORDER — PREDNISONE 5 MG PO TABS: ORAL_TABLET | ORAL | 0 refills | Status: AC

## 2020-03-26 MED ORDER — PENNSAID 2 % EX SOLN: 1.0000 "application " | Freq: Two times a day (BID) | CUTANEOUS | 2 refills | Status: AC

## 2020-03-26 NOTE — Assessment & Plan Note (Signed)
No effusion of the knee but does have hyperemia more laterally over the area of more degenerative change. -Counseled on home exercise therapy and supportive care. -Prednisone. -Can use Pennsaid after the prednisone. -Could consider physical therapy or injection.

## 2020-03-26 NOTE — Assessment & Plan Note (Signed)
Injury occurred on 12/18 at a nail salon.  Has degenerative changes of the shoulder on exam.  Seems likely the fall exacerbated some of these changes. -Counseled on home exercise therapy and supportive care. -Prednisone. -Could consider physical therapy or injection.

## 2020-03-26 NOTE — Progress Notes (Signed)
Vickie Mclean - 49 y.o. female MRN [id number]  Date of birth: [birth year]  SUBJECTIVE:  Including CC & ROS.  Chief Complaint  Patient presents with  . Shoulder Injury  . Foot Injury    Vickie Mclean is a 49 y.o. female that is presenting with left shoulder pain and left knee pain.  She sustained a fall at a nail salon.  She felt that she fell on some water.  She hit the left side of her body.  She is evaluated emergency department.  She continues to have pain in the left knee and the left shoulder.  No prior history of similar pain.  No history of shoulder surgery.  Symptoms seem to be staying the same..  Independent review left shoulder x-ray from 12/19 shows no acute changes.   Review of Systems See HPI   HISTORY: Past Medical, Surgical, Social, and Family History Reviewed & Updated per EMR.   Pertinent Historical Findings include:  Past Medical History:  Diagnosis Date  . High cholesterol   . Hypertension     Past Surgical History:  Procedure Laterality Date  . HERNIA REPAIR      No family history on file.  Social History   Socioeconomic History  . Marital status: Single    Spouse name: Not on file  . Number of children: Not on file  . Years of education: Not on file  . Highest education level: Not on file  Occupational History  . Not on file  Tobacco Use  . Smoking status: Never Smoker  . Smokeless tobacco: Never Used  Substance and Sexual Activity  . Alcohol use: No  . Drug use: No  . Sexual activity: Not Currently    Birth control/protection: None  Other Topics Concern  . Not on file  Social History Narrative  . Not on file   Social Determinants of Health   Financial Resource Strain: Not on file  Food Insecurity: Not on file  Transportation Needs: Not on file  Physical Activity: Not on file  Stress: Not on file  Social Connections: Not on file  Intimate Partner Violence: Not on file     PHYSICAL EXAM:  VS: BP 118/86   Ht 5\' 5"  (1.651 m)    Wt 165 lb (74.8 kg)   BMI 27.46 kg/m  Physical Exam Gen: NAD, alert, cooperative with exam, well-appearing MSK:  Left shoulder: Normal range of motion. Normal strength resistance. Normal empty can test. Left knee: No signs of effusion. Normal strength resistance. Some tenderness palpation along the medial and lateral joint space. Neurovascular intact  Limited ultrasound: Left knee, left shoulder:  Left knee: No effusion suprapatellar pouch. Normal-appearing quadricep patellar tendon. Mild medial joint space narrowing. Moderate degenerative changes of the lateral joint space with increased hyperemia  Left shoulder: Biceps tendon is intact but has encircling effusion. Normal-appearing subscapularis. Degenerative changes of the supraspinatus at the insertion.  No increased hyperemia of the supraspinatus or humeral head. Infraspinatus with degenerative changes at the insertion. No effusion the posterior glenohumeral joint.   Degenerative change of the [facility] joint and no effusion.  Summary: No structural changes acutely of the knee.  Likely exacerbation of underlying degenerative changes.  Has minor degenerative changes of the rotator cuff  Ultrasound and interpretation by Clare Gandy, MD    ASSESSMENT & PLAN:   Acute pain of left shoulder Injury occurred on 12/18 at a nail salon.  Has degenerative changes of the shoulder on exam.  Seems likely the  fall exacerbated some of these changes. -Counseled on home exercise therapy and supportive care. -Prednisone. -Could consider physical therapy or injection.  Contusion of left knee No effusion of the knee but does have hyperemia more laterally over the area of more degenerative change. -Counseled on home exercise therapy and supportive care. -Prednisone. -Can use Pennsaid after the prednisone. -Could consider physical therapy or injection.

## 2020-03-26 NOTE — Patient Instructions (Signed)
Nice to meet you Please try ice  Please try the exercises  Please take the prednisone. Do not take the mobic with this. You can use the rub on medicine (pennsaid) after this   Please send me a message in MyChart with any questions or updates.  Please see me back in 3 weeks.   --Dr. Jordan Likes

## 2020-04-17 ENCOUNTER — Other Ambulatory Visit: Payer: Self-pay

## 2020-04-17 ENCOUNTER — Ambulatory Visit: Payer: BC Managed Care – PPO | Admitting: Family Medicine

## 2020-04-17 ENCOUNTER — Ambulatory Visit: Payer: Self-pay

## 2020-04-17 DIAGNOSIS — S4992XD Unspecified injury of left shoulder and upper arm, subsequent encounter: Secondary | ICD-10-CM

## 2020-04-17 DIAGNOSIS — S8002XD Contusion of left knee, subsequent encounter: Secondary | ICD-10-CM

## 2020-04-17 MED ORDER — TRIAMCINOLONE ACETONIDE 40 MG/ML IJ SUSP
40.0000 mg | Freq: Once | INTRAMUSCULAR | Status: AC
Start: 2020-04-17 — End: 2020-04-17
  Administered 2020-04-17: 40 mg via INTRA_ARTICULAR

## 2020-04-17 MED ORDER — METHYLPREDNISOLONE ACETATE 40 MG/ML IJ SUSP
40.0000 mg | Freq: Once | INTRAMUSCULAR | Status: AC
  Administered 2020-04-17: 40 mg via INTRA_ARTICULAR

## 2020-04-17 NOTE — Assessment & Plan Note (Addendum)
Injury occurred on 12/18.  She has changes at the tibial plateau with increased hyperemia.  May have a bony contusion -Counseled on home exercise therapy and supportive care - injection.  -Referral to physical therapy. -Could consider further imaging

## 2020-04-17 NOTE — Progress Notes (Signed)
Vickie Mclean - 50 y.o. female MRN [id number]  Date of birth: 1970/10/26  SUBJECTIVE:  Including CC & ROS.  No chief complaint on file.   Vickie Mclean is a 50 y.o. female that is presenting with worsening of her left knee pain and left shoulder pain.  These were sustained after a fall at a nail salon.  She is having to take several bouts of Advil per day.  Pain is severe and seems to be worse than when she first arrived.   Review of Systems See HPI   HISTORY: Past Medical, Surgical, Social, and Family History Reviewed & Updated per EMR.   Pertinent Historical Findings include:  Past Medical History:  Diagnosis Date  . High cholesterol   . Hypertension     Past Surgical History:  Procedure Laterality Date  . HERNIA REPAIR      No family history on file.  Social History   Socioeconomic History  . Marital status: Single    Spouse name: Not on file  . Number of children: Not on file  . Years of education: Not on file  . Highest education level: Not on file  Occupational History  . Not on file  Tobacco Use  . Smoking status: Never Smoker  . Smokeless tobacco: Never Used  Substance and Sexual Activity  . Alcohol use: No  . Drug use: No  . Sexual activity: Not Currently    Birth control/protection: None  Other Topics Concern  . Not on file  Social History Narrative  . Not on file   Social Determinants of Health   Financial Resource Strain: Not on file  Food Insecurity: Not on file  Transportation Needs: Not on file  Physical Activity: Not on file  Stress: Not on file  Social Connections: Not on file  Intimate Partner Violence: Not on file     PHYSICAL EXAM:  VS: BP 118/78   Ht 5\' 1"  (1.549 m)   Wt 160 lb (72.6 kg)   BMI 30.23 kg/m  Physical Exam Gen: NAD, alert, cooperative with exam, well-appearing MSK:  Left knee: No effusion. Normal range of motion. Tenderness to palpation over the proximal tibia. Neurovascular intact  Limited  ultrasound: Left knee, left shoulder:  Left knee: Significant hyperemia at the tibial plateau  Left shoulder. Increased hyperemia superficial to the supraspinatus  Summary: Tingling of the left knee could be consistent for insufficiency fracture.  Ultrasound and interpretation by Clare Gandy, MD   Aspiration/Injection Procedure Note Vickie Mclean 1970/10/22  Procedure: Injection Indications: Left knee pain  Procedure Details Consent: Risks of procedure as well as the alternatives and risks of each were explained to the (patient/caregiver).  Consent for procedure obtained. Time Out: Verified patient identification, verified procedure, site/side was marked, verified correct patient position, special equipment/implants available, medications/allergies/relevent history reviewed, required imaging and test results available.  Performed.  The area was cleaned with iodine and alcohol swabs.    The Left knee superior lateral suprapatellar pouch was injected using 5 cc of 1% lidocaine on a 22-gauge 1-1/2 inch needle.  The syringe was switched to mixture containing 1 cc's of 40mg  Kenalog and 4 cc's of 0.25% bupivacaine was injected.  Ultrasound was used. Images were obtained in long views showing the injection.     A sterile dressing was applied.  Patient did tolerate procedure well.   Aspiration/Injection Procedure Note Vickie Mclean 02/06/1971  Procedure: Injection Indications: Left shoulder pain  Procedure Details Consent: Risks of procedure as  well as the alternatives and risks of each were explained to the (patient/caregiver).  Consent for procedure obtained. Time Out: Verified patient identification, verified procedure, site/side was marked, verified correct patient position, special equipment/implants available, medications/allergies/relevent history reviewed, required imaging and test results available.  Performed.  The area was cleaned with iodine and alcohol swabs.     The Left subacromial space was injected using 1 cc's of 40 mg Depo-Medrol and 4 cc's of 0.25% bupivacaine with a 22 1 1/2" needle.  Ultrasound was used. Images were obtained in long views showing the injection.     A sterile dressing was applied.  Patient did tolerate procedure well.    ASSESSMENT & PLAN:   Shoulder injury, left, subsequent encounter Injury occurred on 12/18.  Has hyperemia at the footprint of the supraspinatus. -Counseled on home exercise therapy and supportive care. - injection.  -Referral to physical therapy. -Could consider further imaging.   Contusion of left knee Injury occurred on 12/18.  She has changes at the tibial plateau with increased hyperemia.  May have a bony contusion -Counseled on home exercise therapy and supportive care - injection.  -Referral to physical therapy. -Could consider further imaging

## 2020-04-17 NOTE — Patient Instructions (Signed)
Good to see you Please try ice as needed  Physical therapy will give you a call   Please send me a message in MyChart with any questions or updates.  Please see me back in 4 weeks.   --Dr. Jordan Likes

## 2020-04-17 NOTE — Assessment & Plan Note (Addendum)
Injury occurred on 12/18.  Has hyperemia at the footprint of the supraspinatus. -Counseled on home exercise therapy and supportive care. - injection.  -Referral to physical therapy. -Could consider further imaging.

## 2020-05-05 ENCOUNTER — Ambulatory Visit: Payer: BC Managed Care – PPO | Admitting: Physical Therapy

## 2020-05-06 ENCOUNTER — Telehealth: Payer: Self-pay | Admitting: Family Medicine

## 2020-05-06 NOTE — Telephone Encounter (Signed)
Med recs request rcvd frm [facility]Attempts to fax recs multiple times unsuccessful to [phone]--  ---Mailed to :  [employer] [facility] [id number] [place] [address]  --Fausto Skillern

## 2020-05-22 ENCOUNTER — Encounter: Payer: Self-pay | Admitting: Family Medicine

## 2020-05-22 ENCOUNTER — Other Ambulatory Visit: Payer: Self-pay

## 2020-05-22 ENCOUNTER — Ambulatory Visit: Payer: BC Managed Care – PPO | Admitting: Family Medicine

## 2020-05-22 ENCOUNTER — Ambulatory Visit: Payer: BC Managed Care – PPO | Attending: Family Medicine | Admitting: Physical Therapy

## 2020-05-22 DIAGNOSIS — M84453A Pathological fracture, unspecified femur, initial encounter for fracture: Secondary | ICD-10-CM

## 2020-05-22 DIAGNOSIS — S43432D Superior glenoid labrum lesion of left shoulder, subsequent encounter: Secondary | ICD-10-CM | POA: Diagnosis not present

## 2020-05-22 NOTE — Assessment & Plan Note (Addendum)
Minimal improvement with injection. Pain is still ongoing from trauma on 12/18.  Possible for meniscus etiology as well. -Counseled on home exercise therapy and supportive care. -Continue physical therapy. -MRI to evaluate for insufficiency fracture versus meniscal tear.

## 2020-05-22 NOTE — Assessment & Plan Note (Addendum)
Pain still occurring. Concern for labral tear with limited improvement with injection and therapy thus far.  -Counseled on home exercise therapy and supportive care. -Continue physical therapy. -MRI to evaluate for labral tear.

## 2020-05-22 NOTE — Progress Notes (Signed)
Vickie Mclean - 50 y.o. female MRN [id number]  Date of birth: November 28, 1970  SUBJECTIVE:  Including CC & ROS.  No chief complaint on file.   Vickie Mclean is a 50 y.o. female that is following up for her left shoulder and left knee pain.  She is still having ongoing pain despite measures thus far.   Review of Systems See HPI   HISTORY: Past Medical, Surgical, Social, and Family History Reviewed & Updated per EMR.   Pertinent Historical Findings include:  Past Medical History:  Diagnosis Date  . High cholesterol   . Hypertension     Past Surgical History:  Procedure Laterality Date  . HERNIA REPAIR      No family history on file.  Social History   Socioeconomic History  . Marital status: Single    Spouse name: Not on file  . Number of children: Not on file  . Years of education: Not on file  . Highest education level: Not on file  Occupational History  . Not on file  Tobacco Use  . Smoking status: Never Smoker  . Smokeless tobacco: Never Used  Substance and Sexual Activity  . Alcohol use: No  . Drug use: No  . Sexual activity: Not Currently    Birth control/protection: None  Other Topics Concern  . Not on file  Social History Narrative  . Not on file   Social Determinants of Health   Financial Resource Strain: Not on file  Food Insecurity: Not on file  Transportation Needs: Not on file  Physical Activity: Not on file  Stress: Not on file  Social Connections: Not on file  Intimate Partner Violence: Not on file     PHYSICAL EXAM:  VS: BP 90/70 (BP Location: Left Arm, Patient Position: Sitting, Cuff Size: Large)   Ht 5\' 1"  (1.549 m)   Wt 160 lb (72.6 kg)   BMI 30.23 kg/m  Physical Exam Gen: NAD, alert, cooperative with exam, well-appearing MSK:  Left shoulder: Normal internal and external rotation. Normal strength resistance. Positive empty can test. Positive O'Brien's test. Left knee: Mild effusion. Normal range of motion. Instability  with valgus varus stress testing. Positive McMurray's test. Neurovascularly intact     ASSESSMENT & PLAN:   Closed subchondral insufficiency fracture of femoral condyle (HCC) Minimal improvement with injection. Pain is still ongoing from trauma on 12/18.  Possible for meniscus etiology as well. -Counseled on home exercise therapy and supportive care. -Continue physical therapy. -MRI to evaluate for insufficiency fracture versus meniscal tear.   Labral tear of shoulder Pain still occurring. Concern for labral tear with limited improvement with injection and therapy thus far.  -Counseled on home exercise therapy and supportive care. -Continue physical therapy. -MRI to evaluate for labral tear.

## 2020-05-22 NOTE — Patient Instructions (Signed)
Good to see you Please call [phone] to schedule the MRI   Please send me a message in MyChart with any questions or updates.  We will setup a virtual visit once the MRI is resulted.   --Dr. Jaydence Arnesen  

## 2020-06-17 ENCOUNTER — Ambulatory Visit: Payer: BC Managed Care – PPO | Admitting: Family Medicine

## 2020-06-17 ENCOUNTER — Other Ambulatory Visit: Payer: Self-pay

## 2020-06-17 VITALS — BP 138/98 | Ht 61.0 in | Wt 160.0 lb

## 2020-06-17 DIAGNOSIS — S43432D Superior glenoid labrum lesion of left shoulder, subsequent encounter: Secondary | ICD-10-CM

## 2020-06-17 DIAGNOSIS — M84453A Pathological fracture, unspecified femur, initial encounter for fracture: Secondary | ICD-10-CM | POA: Diagnosis not present

## 2020-06-17 MED ORDER — HYDROCODONE-ACETAMINOPHEN 5-325 MG PO TABS
1.0000 | ORAL_TABLET | Freq: Three times a day (TID) | ORAL | 0 refills | Status: AC | PRN
Start: 2020-06-17 — End: ?

## 2020-06-17 MED ORDER — METHYLPREDNISOLONE ACETATE 40 MG/ML IJ SUSP
40.0000 mg | Freq: Once | INTRAMUSCULAR | Status: AC
  Administered 2020-06-17: 40 mg via INTRAMUSCULAR

## 2020-06-17 MED ORDER — CYCLOBENZAPRINE HCL 5 MG PO TABS: 5.0000 mg | ORAL_TABLET | Freq: Three times a day (TID) | ORAL | 0 refills | Status: AC | PRN

## 2020-06-17 NOTE — Patient Instructions (Signed)
Good to see you Please try the muscle relaxer  The norco is a pain medicine that can be taken as needed for severe pain.   Please send me a message in MyChart with any questions or updates.  We will setup a virtual visit once the MRI is resulted.   --Dr. Jordan Likes

## 2020-06-17 NOTE — Assessment & Plan Note (Addendum)
Acute worsening of her pain.  Pain is severe and limiting her sleep.  She does have her MRI scheduled for this Saturday. -Counseled home exercise therapy and supportive care. -Norco.

## 2020-06-17 NOTE — Assessment & Plan Note (Signed)
Having a spasm that goes up to the neck now.  Has MRI scheduled for this Saturday.  Pain is severe to where she is unable to sleep. -Counseled on exercise therapy and supportive care. -Flexeril. -IM Depo

## 2020-06-17 NOTE — Progress Notes (Signed)
Vickie Mclean - 50 y.o. female MRN [id number]  Date of birth: [birth year]  SUBJECTIVE:  Including CC & ROS.  No chief complaint on file.   Vickie Mclean is a 50 y.o. female that is presenting with acute worsening of her left shoulder and left knee pain.  She had significant pain over the past weekend.  She is having pain is traveling to her neck.   Review of Systems See HPI   HISTORY: Past Medical, Surgical, Social, and Family History Reviewed & Updated per EMR.   Pertinent Historical Findings include:  Past Medical History:  Diagnosis Date  . High cholesterol   . Hypertension     Past Surgical History:  Procedure Laterality Date  . HERNIA REPAIR      No family history on file.  Social History   Socioeconomic History  . Marital status: Single    Spouse name: Not on file  . Number of children: Not on file  . Years of education: Not on file  . Highest education level: Not on file  Occupational History  . Not on file  Tobacco Use  . Smoking status: Never Smoker  . Smokeless tobacco: Never Used  Substance and Sexual Activity  . Alcohol use: No  . Drug use: No  . Sexual activity: Not Currently    Birth control/protection: None  Other Topics Concern  . Not on file  Social History Narrative  . Not on file   Social Determinants of Health   Financial Resource Strain: Not on file  Food Insecurity: Not on file  Transportation Needs: Not on file  Physical Activity: Not on file  Stress: Not on file  Social Connections: Not on file  Intimate Partner Violence: Not on file     PHYSICAL EXAM:  VS: BP (!) 138/98 (BP Location: Right Arm, Patient Position: Sitting, Cuff Size: Large)   Ht 5\' 1"  (1.549 m)   Wt 160 lb (72.6 kg)   BMI 30.23 kg/m  Physical Exam Gen: NAD, alert, cooperative with exam, well-appearing    ASSESSMENT & PLAN:   Labral tear of shoulder Having a spasm that goes up to the neck now.  Has MRI scheduled for this Saturday.  Pain is severe  to where she is unable to sleep. -Counseled on exercise therapy and supportive care. -Flexeril. -IM Depo  Closed subchondral insufficiency fracture of femoral condyle (HCC) Acute worsening of her pain.  Pain is severe and limiting her sleep.  She does have her MRI scheduled for this Saturday. -Counseled home exercise therapy and supportive care. -Norco.

## 2020-06-21 ENCOUNTER — Ambulatory Visit
Admission: RE | Admit: 2020-06-21 | Discharge: 2020-06-21 | Disposition: A | Discharge status: Home / Self Care | Payer: BC Managed Care – PPO | Source: Ambulatory Visit | Attending: Family Medicine | Admitting: Family Medicine

## 2020-06-21 ENCOUNTER — Other Ambulatory Visit: Payer: Self-pay

## 2020-06-21 DIAGNOSIS — R531 Weakness: Secondary | ICD-10-CM | POA: Diagnosis not present

## 2020-06-21 DIAGNOSIS — S43432D Superior glenoid labrum lesion of left shoulder, subsequent encounter: Secondary | ICD-10-CM

## 2020-06-21 DIAGNOSIS — M948X1 Other specified disorders of cartilage, shoulder: Secondary | ICD-10-CM | POA: Diagnosis not present

## 2020-06-21 DIAGNOSIS — R600 Localized edema: Secondary | ICD-10-CM | POA: Diagnosis not present

## 2020-06-21 DIAGNOSIS — M84453A Pathological fracture, unspecified femur, initial encounter for fracture: Secondary | ICD-10-CM

## 2020-06-21 DIAGNOSIS — M75122 Complete rotator cuff tear or rupture of left shoulder, not specified as traumatic: Secondary | ICD-10-CM | POA: Diagnosis not present

## 2020-06-21 DIAGNOSIS — M25562 Pain in left knee: Secondary | ICD-10-CM | POA: Diagnosis not present

## 2020-06-21 DIAGNOSIS — M25462 Effusion, left knee: Secondary | ICD-10-CM | POA: Diagnosis not present

## 2020-06-21 DIAGNOSIS — M19012 Primary osteoarthritis, left shoulder: Secondary | ICD-10-CM | POA: Diagnosis not present

## 2020-06-21 IMAGING — MR MR SHOULDER*L* W/O CM
5 series · 40 of 40 positions shown · non-contrast
Comparison: None.

CLINICAL DATA: Left shoulder pain with popping and weakness in the
elbow.

EXAM:
MRI OF THE LEFT SHOULDER WITHOUT CONTRAST
TECHNIQUE: Multiplanar, multisequence MR imaging of the shoulder was performed.
No intravenous contrast was administered.

[Series 3: T2 fat-sat · axial · 4.0mm · 0.27mm/px · z∈[-26,+53]mm · 8 of 17 slices shown (1 of 3)]
[im 1/17]
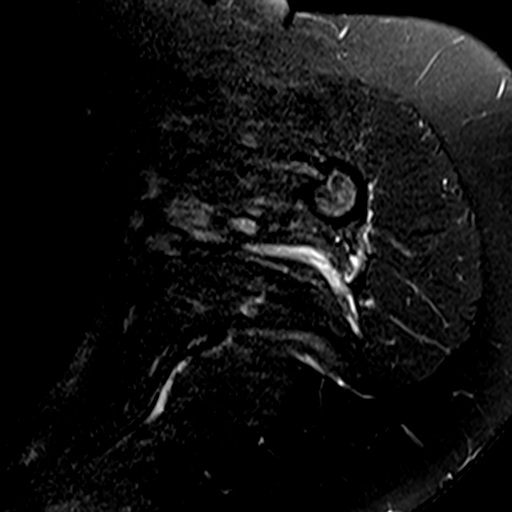
[im 3/17]
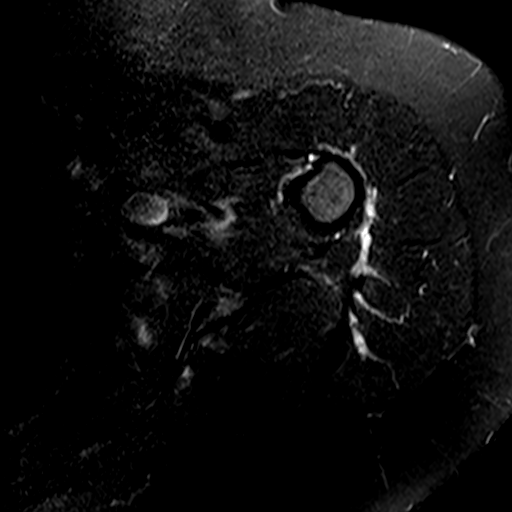
[im 5/17]
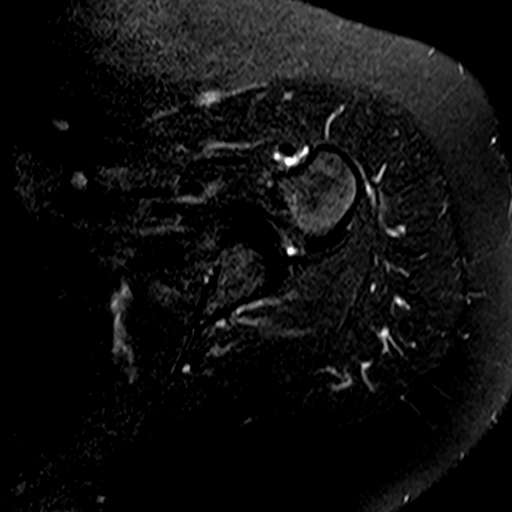
[im 7/17]
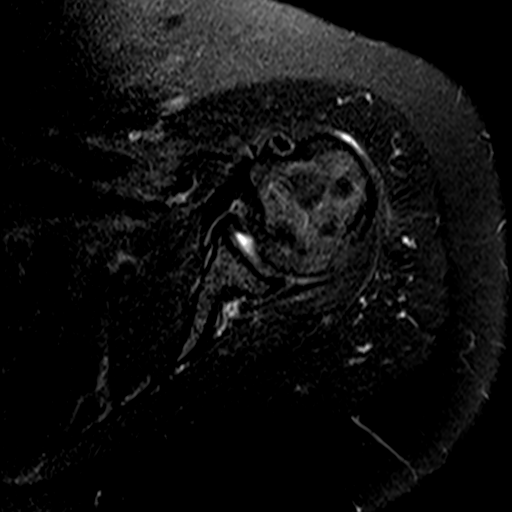
[im 10/17]
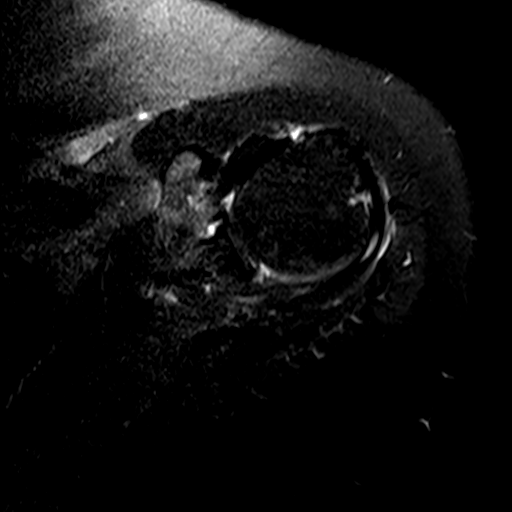
[im 12/17]
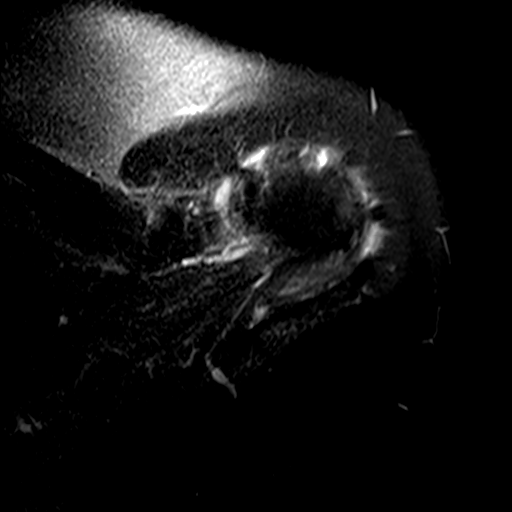
[im 14/17]
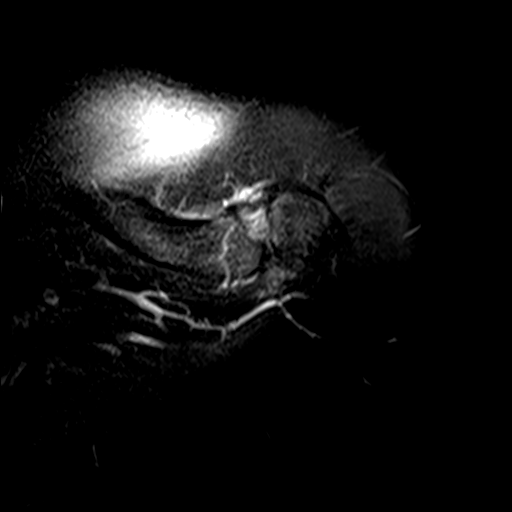
[im 17/17]
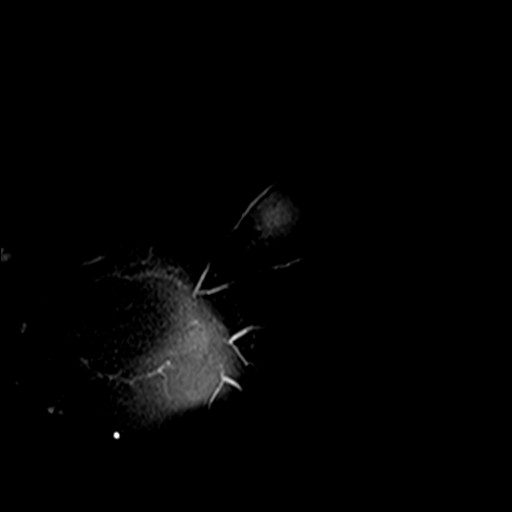

[Series 4: T2 fat-sat · oblique · 4.0mm · 0.59mm/px · 8 of 17 slices shown (2 of 3)]
[im 1/17]
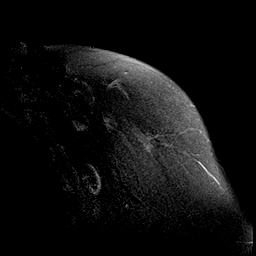
[im 3/17]
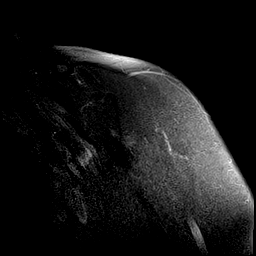
[im 5/17]
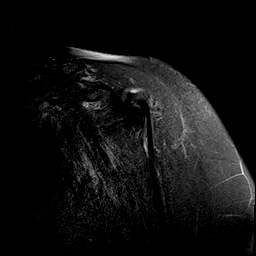
[im 7/17]
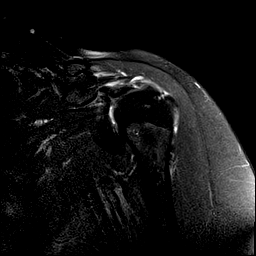
[im 10/17]
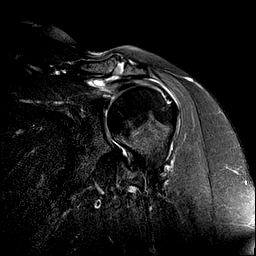
[im 12/17]
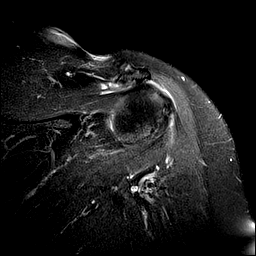
[im 14/17]
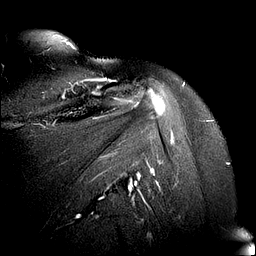
[im 17/17]
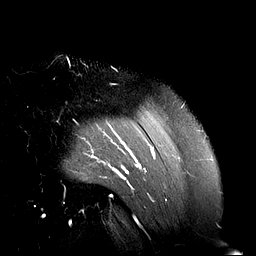

[Series 5: PD · oblique · 4.0mm · 0.59mm/px · 8 of 17 slices shown]
[im 1/17]
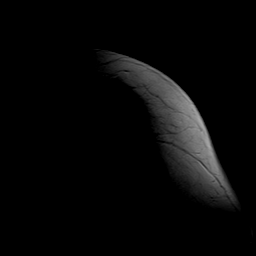
[im 3/17]
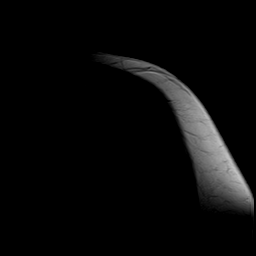
[im 5/17]
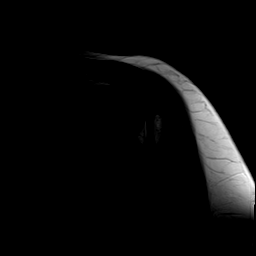
[im 7/17]
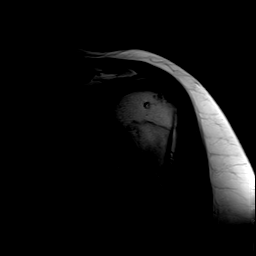
[im 10/17]
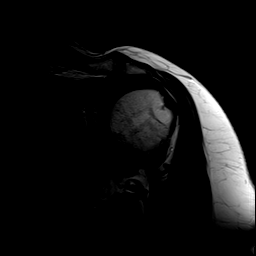
[im 12/17]
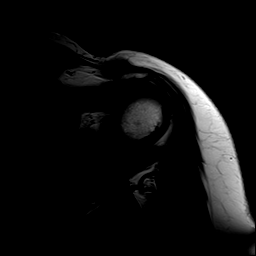
[im 14/17]
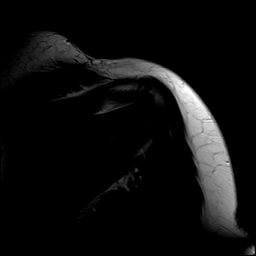
[im 17/17]
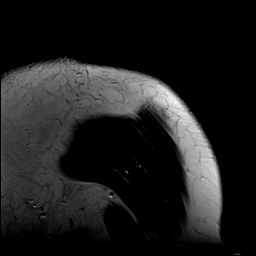

[Series 6: T2 fat-sat · oblique · 4.0mm · 0.59mm/px · 8 of 16 slices shown (3 of 3)]
[im 1/16]
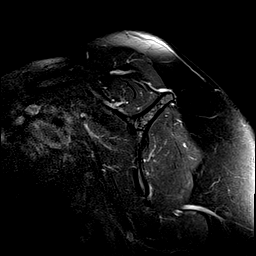
[im 3/16]
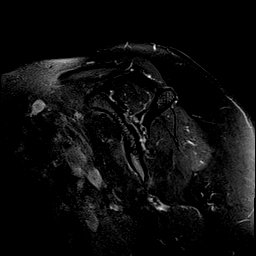
[im 5/16]
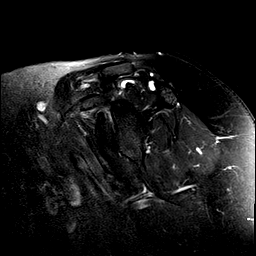
[im 7/16]
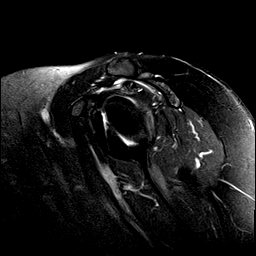
[im 9/16]
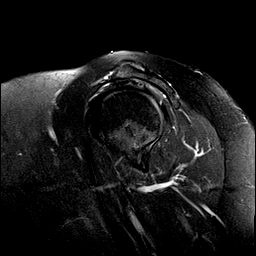
[im 11/16]
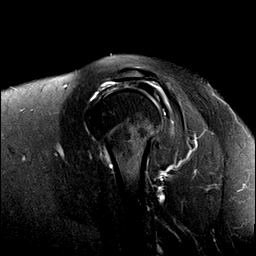
[im 13/16]
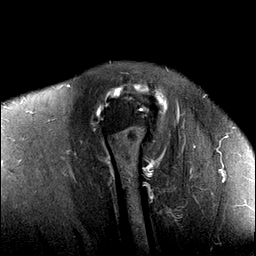
[im 16/16]
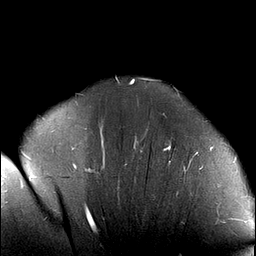

[Series 7: T1 · oblique · 4.0mm · 0.59mm/px · 8 of 16 slices shown]
[im 1/16]
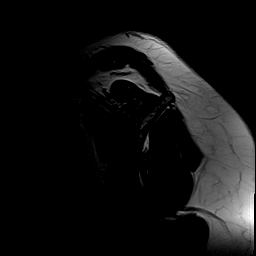
[im 3/16]
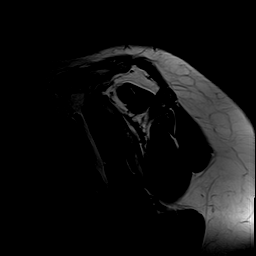
[im 5/16]
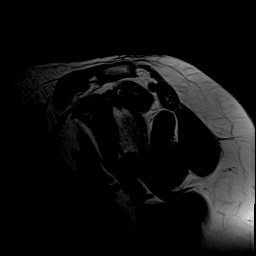
[im 7/16]
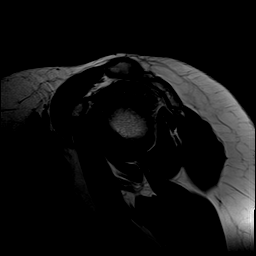
[im 9/16]
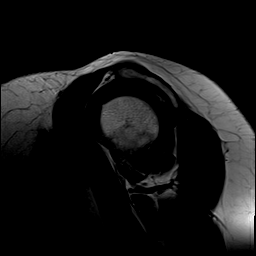
[im 11/16]
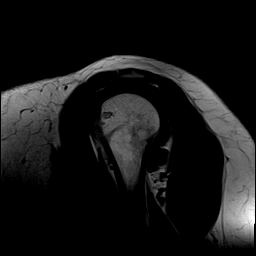
[im 13/16]
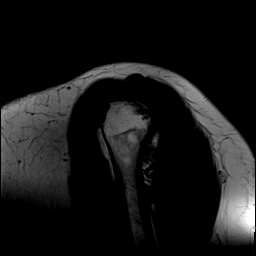
[im 16/16]
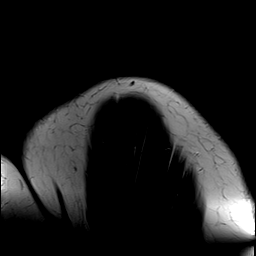

[40 of 40 positions shown; findings below may reference images not displayed]

FINDINGS: Rotator cuff: Severe tendinosis of the supraspinatus tendon with a
large full-thickness tear anteriorly and a 15 mm of retraction.
Moderate tendinosis of the infraspinatus tendon. Teres minor tendon
is intact. Subscapularis tendon is intact.

Muscles: No muscle atrophy or edema. No intramuscular fluid
collection or hematoma.

Biceps Long Head: Mild tendinosis of the intra-articular portion of
the long head of the biceps tendon.

Acromioclavicular Joint: Moderate arthropathy of the
acromioclavicular joint. Type II acromion. Trace
subacromial/subdeltoid bursal fluid.

Glenohumeral Joint: No joint effusion. Partial-thickness cartilage
loss of the glenohumeral joint.

Labrum: Limited evaluation secondary lack of intra-articular fluid.
Superior labral degeneration.

Bones: No fracture or dislocation. No aggressive osseous lesion.

Other: No fluid collection or hematoma.
IMPRESSION: 1. Severe tendinosis of the supraspinatus tendon with a large
full-thickness tear anteriorly and a 15 mm of retraction.
2. Moderate tendinosis of the infraspinatus tendon.
3. Mild tendinosis of the intra-articular portion of the long head
of the biceps tendon.

## 2020-06-21 IMAGING — MR MR KNEE*L* W/O CM
4 of 7 series · 20 of 40 positions shown · non-contrast
Comparison: None.

CLINICAL DATA: Status post fall. Medial pain and popping.

EXAM:
MRI OF THE LEFT KNEE WITHOUT CONTRAST
TECHNIQUE: Multiplanar, multisequence MR imaging of the knee was performed. No
intravenous contrast was administered.

[Series 3: T2 fat-sat · axial · 4.0mm · 0.50mm/px · z∈[-48,+47]mm · 3 of 24 slices shown]
[im 5/24]
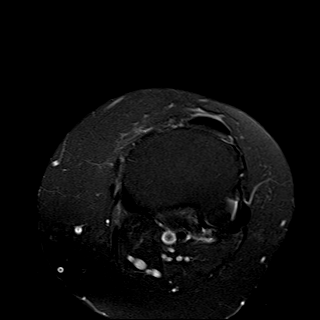
[im 14/24]
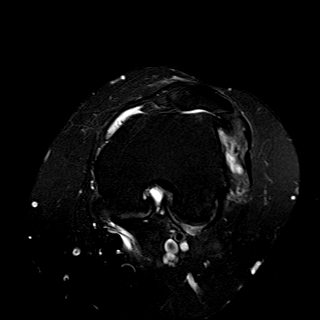
[im 24/24]
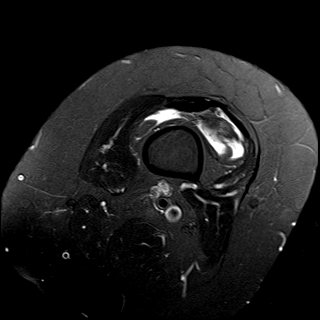

[Series 7: PD fat-sat · sagittal · 3.0mm · 0.29mm/px · 6 of 27 slices shown (1 of 3)]
[im 1/27]
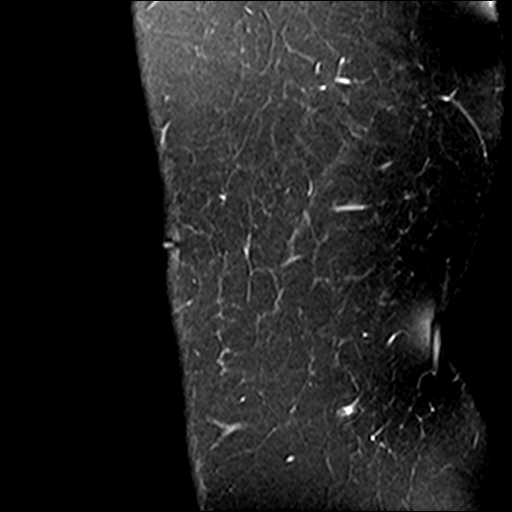
[im 6/27]
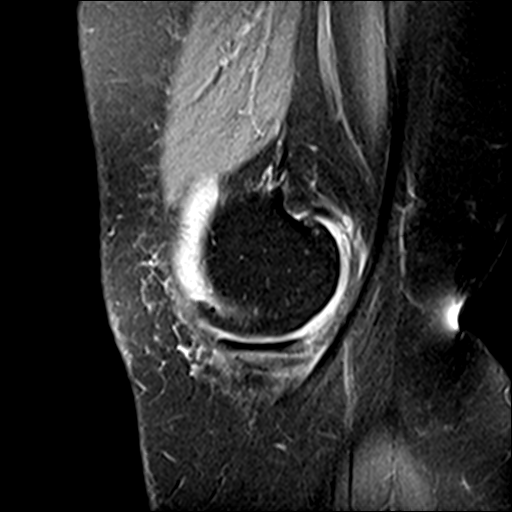
[im 11/27]
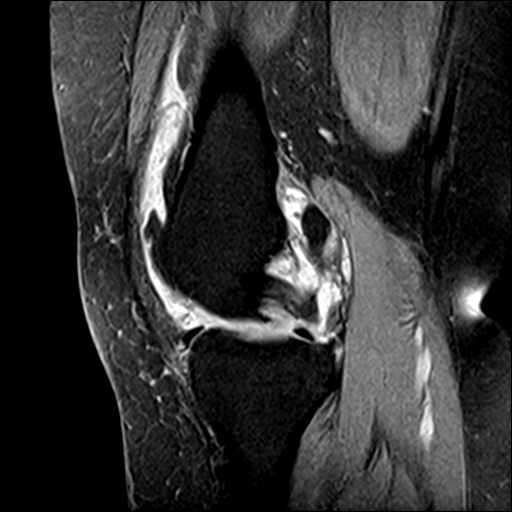
[im 16/27]
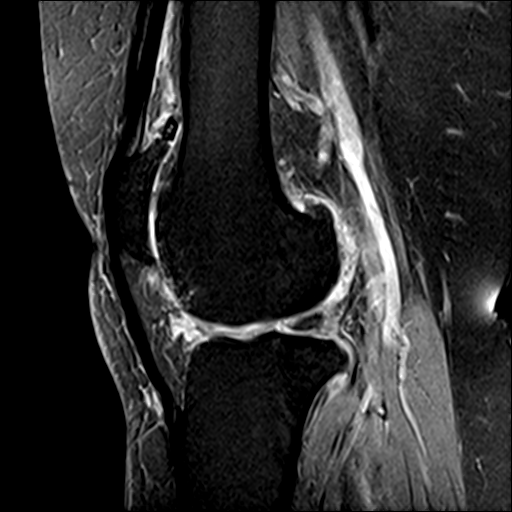
[im 21/27]
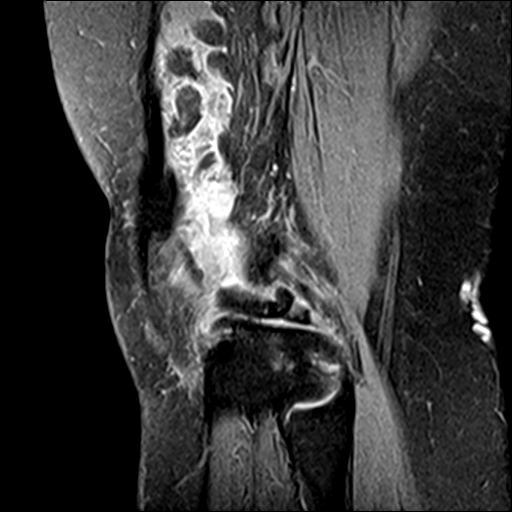
[im 27/27]
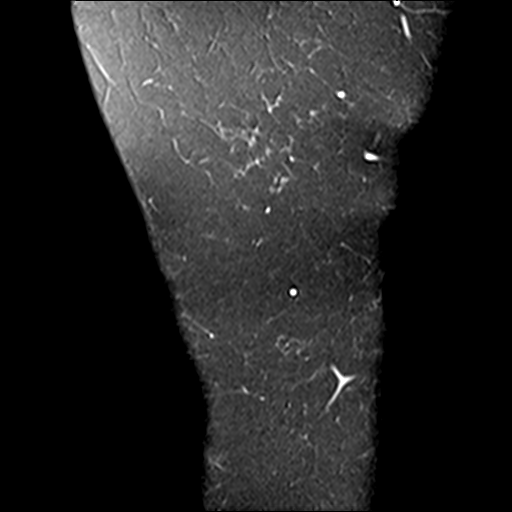

[Series 8: PD fat-sat · coronal · 3.0mm · 0.29mm/px · 6 of 26 slices shown (2 of 3)]
[im 1/26]
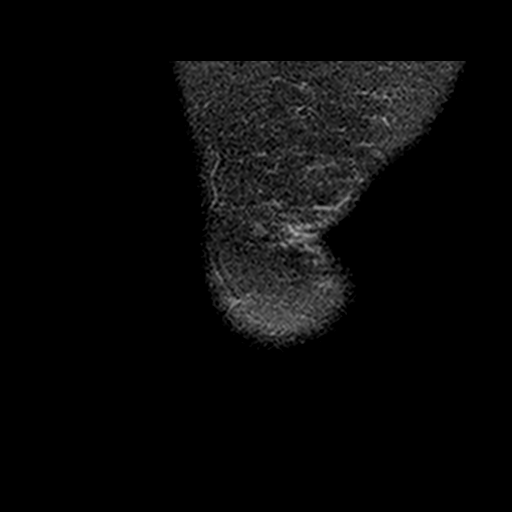
[im 6/26]
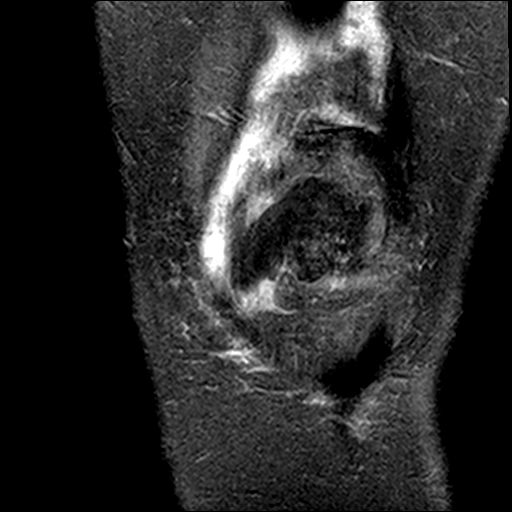
[im 11/26]
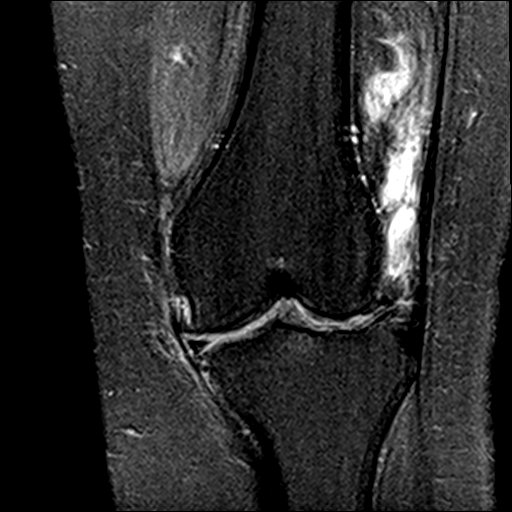
[im 16/26]
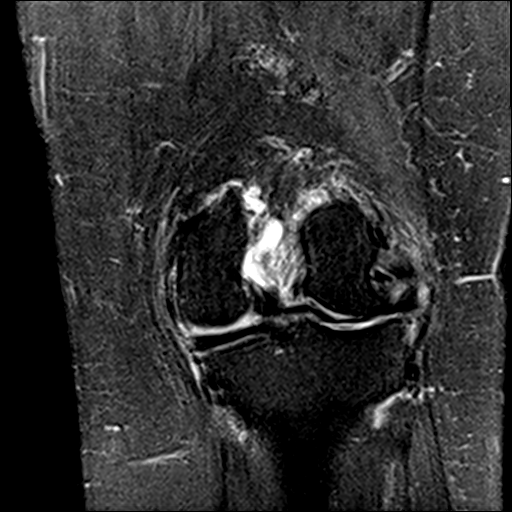
[im 21/26]
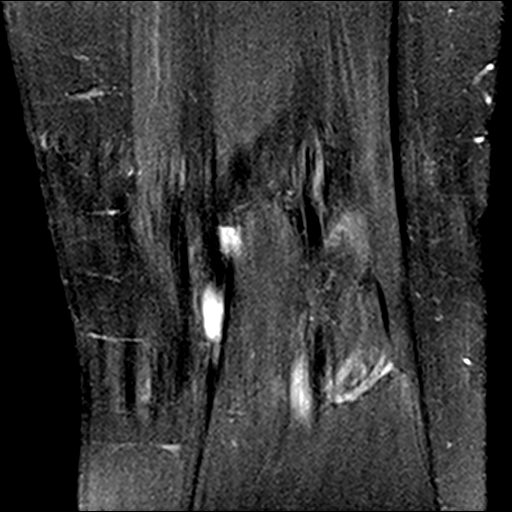
[im 26/26]
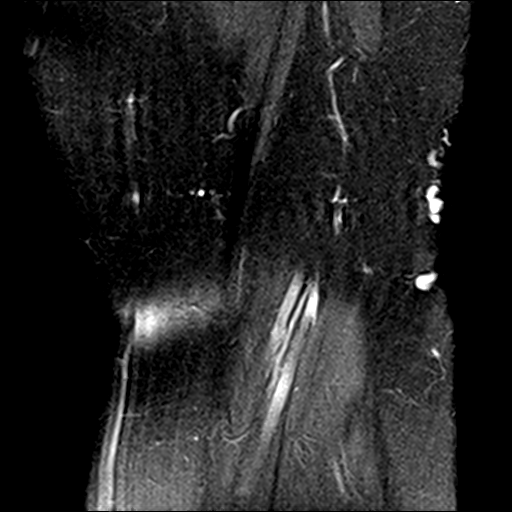

[Series 9: PD fat-sat · coronal · 3.0mm · 0.29mm/px · 5 of 26 slices shown (3 of 3)]
[im 1/26]
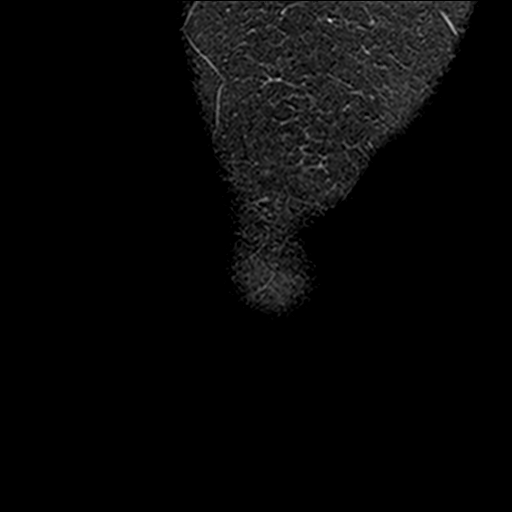
[im 6/26]
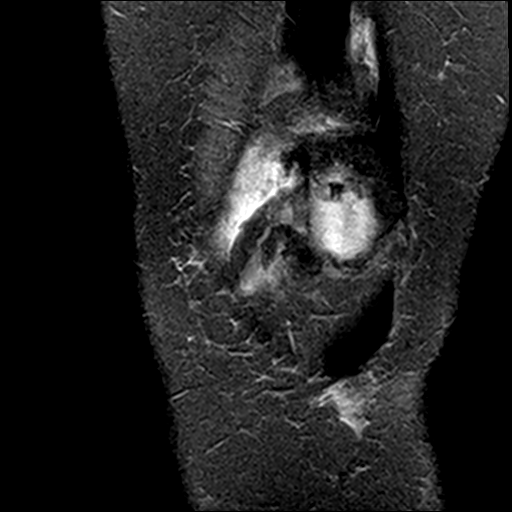
[im 11/26]
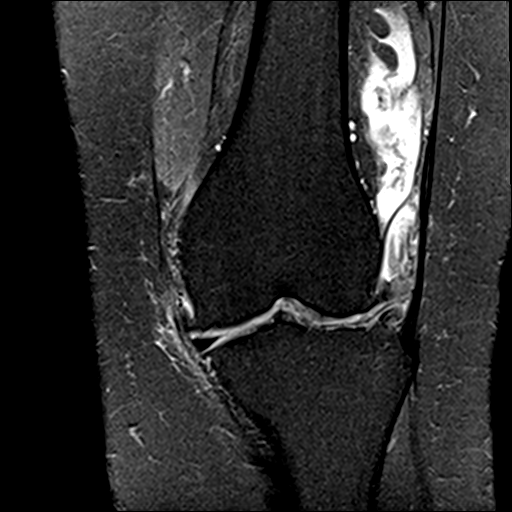
[im 16/26]
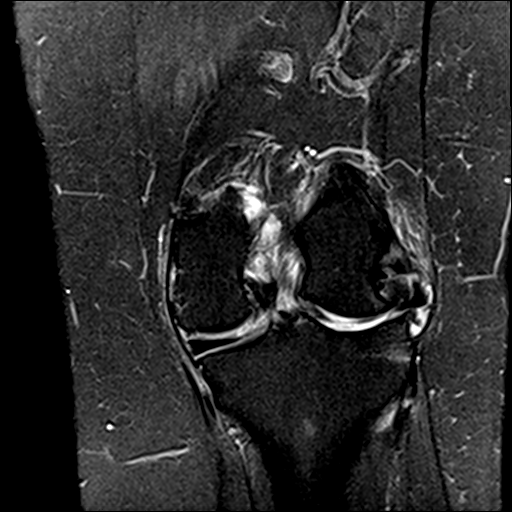
[im 26/26]
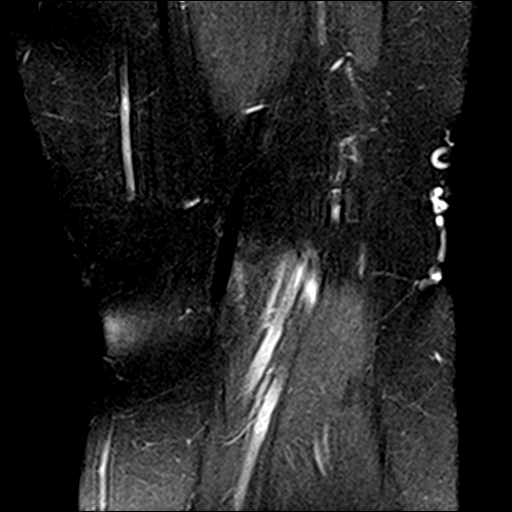

[20 of 40 positions shown; findings below may reference images not displayed]

FINDINGS: MENISCI

Medial: Intact.

Lateral: Maceration of the anterior horn of the lateral meniscus.
Severe peripheral meniscal extrusion of the body of the lateral
meniscus.

LIGAMENTS

Cruciates: Intact ACL and PCL. ACL is expanded and increased in
signal consistent with mucinous degeneration with subcortical
reactive marrow changes at the origin and insertion.

Collaterals: Medial collateral ligament is intact. Lateral
collateral ligament complex is intact.

CARTILAGE

Patellofemoral: Extensive full-thickness cartilage loss of the
patellofemoral compartment.

Medial: Partial-thickness cartilage loss of the medial femorotibial
compartment.

Lateral: Extensive full-thickness cartilage loss of the lateral
femorotibial compartment marginal osteophytes and subchondral
reactive marrow edema.

JOINT: Large joint effusion. Mild edema in Hoffa's fat. No plical
thickening.

POPLITEAL FOSSA: Popliteus tendon is intact. Small Baker's cyst.

EXTENSOR MECHANISM: Intact quadriceps tendon. Intact patellar
tendon. Intact lateral patellar retinaculum. Intact medial patellar
retinaculum. Intact MPFL.

BONES: No aggressive osseous lesion. No fracture or dislocation.

Other: No fluid collection or hematoma. Muscles are normal.
IMPRESSION: 1. Maceration of the anterior horn of the lateral meniscus. Severe
peripheral meniscal extrusion of the body of the lateral meniscus.
2. Tricompartmental cartilage abnormalities as described above most
severe in the patellofemoral and lateral femorotibial compartment.
3. Large joint effusion.
4. Intact ACL with mucinous degeneration.

## 2020-06-25 ENCOUNTER — Telehealth (INDEPENDENT_AMBULATORY_CARE_PROVIDER_SITE_OTHER): Payer: BC Managed Care – PPO | Admitting: Family Medicine

## 2020-06-25 ENCOUNTER — Other Ambulatory Visit: Payer: Self-pay

## 2020-06-25 DIAGNOSIS — S46012D Strain of muscle(s) and tendon(s) of the rotator cuff of left shoulder, subsequent encounter: Secondary | ICD-10-CM | POA: Diagnosis not present

## 2020-06-25 DIAGNOSIS — S83282D Other tear of lateral meniscus, current injury, left knee, subsequent encounter: Secondary | ICD-10-CM | POA: Diagnosis not present

## 2020-06-25 NOTE — Progress Notes (Signed)
Virtual Visit via Video Note  I connected with [name] on 06/25/20 at  1:20 PM EDT by a video enabled telemedicine application and verified that I am speaking with the correct person using two identifiers.  Location: Patient: home Provider: office   I discussed the limitations of evaluation and management by telemedicine and the availability of in person appointments. The patient expressed understanding and agreed to proceed.  History of Present Illness:  [name] is a 50 year old female that is following up after the MRI of her left shoulder and knee.  These were sustained after a fall at a nail salon.   Observations/Objective:  Gen: NAD, alert, cooperative with exam, well-appearing  Assessment and Plan:  Lateral meniscus tear of left knee: MRI was revealing for extrusion of the body of the lateral meniscus and lateral compartment and she is also demonstrating degenerative changes lateral compartment with reactive marrow edema.  We have tried injections and medications and therapy.  Injury was sustained after a fall at a nail salon. -Counseled on home exercise therapy and supportive care. -Referral to orthopedic surgery.  Rotator cuff tear of left shoulder: MRI was revealing for a large full-thickness tear anteriorly of the supraspinatus tendon.  We have tried injections and therapy with limited movement.  Injury was sustained after a fall at a nail salon. -Counseled on home exercise therapy and supportive care. -Referral to orthopedic surgery.  Follow Up Instructions:    I discussed the assessment and treatment plan with the patient. The patient was provided an opportunity to ask questions and all were answered. The patient agreed with the plan and demonstrated an understanding of the instructions.   The patient was advised to call back or seek an in-person evaluation if the symptoms worsen or if the condition fails to improve as anticipated.   Clare Gandy,  MD

## 2020-06-25 NOTE — Assessment & Plan Note (Addendum)
MRI was revealing for a large full-thickness tear anteriorly of the supraspinatus tendon.  We have tried injections and therapy with limited movement.  Injury was sustained after a fall at a nail salon. -Counseled on home exercise therapy and supportive care. -Referral to orthopedic surgery.

## 2020-06-25 NOTE — Progress Notes (Signed)
[facility] [address] [place] [phone] Dr Jerl Santos  Monday 06/30/20 at 1145a   [facility] [address] [place] [phone] Dr [address] Filter Friday 07/04/20 @ 945a

## 2020-06-25 NOTE — Assessment & Plan Note (Signed)
MRI was revealing for extrusion of the body of the lateral meniscus and lateral compartment and she is also demonstrating degenerative changes lateral compartment with reactive marrow edema.  We have tried injections and medications and therapy. -Counseled on home exercise therapy and supportive care. -Referral to orthopedic surgery.

## 2020-06-30 DIAGNOSIS — M1712 Unilateral primary osteoarthritis, left knee: Secondary | ICD-10-CM | POA: Diagnosis not present

## 2020-06-30 DIAGNOSIS — M25562 Pain in left knee: Secondary | ICD-10-CM | POA: Diagnosis not present

## 2020-07-04 DIAGNOSIS — M75122 Complete rotator cuff tear or rupture of left shoulder, not specified as traumatic: Secondary | ICD-10-CM | POA: Diagnosis not present

## 2020-07-08 ENCOUNTER — Telehealth: Payer: Self-pay | Admitting: Family Medicine

## 2020-07-08 NOTE — Telephone Encounter (Signed)
Talked to patient about her questions.   Vickie Rude, MD [facility] 07/08/2020, 12:54 PM

## 2020-07-17 DIAGNOSIS — M7522 Bicipital tendinitis, left shoulder: Secondary | ICD-10-CM | POA: Diagnosis not present

## 2020-07-17 DIAGNOSIS — W19XXXA Unspecified fall, initial encounter: Secondary | ICD-10-CM | POA: Diagnosis not present

## 2020-07-17 DIAGNOSIS — G8918 Other acute postprocedural pain: Secondary | ICD-10-CM | POA: Diagnosis not present

## 2020-07-17 DIAGNOSIS — X58XXXA Exposure to other specified factors, initial encounter: Secondary | ICD-10-CM | POA: Diagnosis not present

## 2020-07-17 DIAGNOSIS — S46012A Strain of muscle(s) and tendon(s) of the rotator cuff of left shoulder, initial encounter: Secondary | ICD-10-CM | POA: Diagnosis not present

## 2020-07-17 DIAGNOSIS — M7542 Impingement syndrome of left shoulder: Secondary | ICD-10-CM | POA: Diagnosis not present

## 2020-08-06 DIAGNOSIS — Z9889 Other specified postprocedural states: Secondary | ICD-10-CM | POA: Diagnosis not present

## 2020-08-11 DIAGNOSIS — M75122 Complete rotator cuff tear or rupture of left shoulder, not specified as traumatic: Secondary | ICD-10-CM | POA: Diagnosis not present

## 2020-08-13 DIAGNOSIS — M75122 Complete rotator cuff tear or rupture of left shoulder, not specified as traumatic: Secondary | ICD-10-CM | POA: Diagnosis not present

## 2020-08-18 DIAGNOSIS — M75122 Complete rotator cuff tear or rupture of left shoulder, not specified as traumatic: Secondary | ICD-10-CM | POA: Diagnosis not present

## 2020-08-22 DIAGNOSIS — M1711 Unilateral primary osteoarthritis, right knee: Secondary | ICD-10-CM | POA: Diagnosis not present

## 2020-08-22 DIAGNOSIS — M1712 Unilateral primary osteoarthritis, left knee: Secondary | ICD-10-CM | POA: Diagnosis not present

## 2020-08-22 DIAGNOSIS — M75122 Complete rotator cuff tear or rupture of left shoulder, not specified as traumatic: Secondary | ICD-10-CM | POA: Diagnosis not present

## 2020-08-25 DIAGNOSIS — M75122 Complete rotator cuff tear or rupture of left shoulder, not specified as traumatic: Secondary | ICD-10-CM | POA: Diagnosis not present

## 2020-08-28 DIAGNOSIS — M75122 Complete rotator cuff tear or rupture of left shoulder, not specified as traumatic: Secondary | ICD-10-CM | POA: Diagnosis not present

## 2020-09-12 DIAGNOSIS — M75122 Complete rotator cuff tear or rupture of left shoulder, not specified as traumatic: Secondary | ICD-10-CM | POA: Diagnosis not present

## 2020-09-16 DIAGNOSIS — M75122 Complete rotator cuff tear or rupture of left shoulder, not specified as traumatic: Secondary | ICD-10-CM | POA: Diagnosis not present

## 2020-09-23 DIAGNOSIS — M75122 Complete rotator cuff tear or rupture of left shoulder, not specified as traumatic: Secondary | ICD-10-CM | POA: Diagnosis not present

## 2020-09-25 DIAGNOSIS — M75122 Complete rotator cuff tear or rupture of left shoulder, not specified as traumatic: Secondary | ICD-10-CM | POA: Diagnosis not present

## 2020-10-08 DIAGNOSIS — M75122 Complete rotator cuff tear or rupture of left shoulder, not specified as traumatic: Secondary | ICD-10-CM | POA: Diagnosis not present

## 2020-10-17 DIAGNOSIS — F329 Major depressive disorder, single episode, unspecified: Secondary | ICD-10-CM | POA: Diagnosis not present

## 2020-10-17 DIAGNOSIS — R519 Headache, unspecified: Secondary | ICD-10-CM | POA: Diagnosis not present

## 2020-10-17 DIAGNOSIS — K5900 Constipation, unspecified: Secondary | ICD-10-CM | POA: Diagnosis not present

## 2020-10-17 DIAGNOSIS — I10 Essential (primary) hypertension: Secondary | ICD-10-CM | POA: Diagnosis not present

## 2020-10-23 DIAGNOSIS — I10 Essential (primary) hypertension: Secondary | ICD-10-CM | POA: Diagnosis not present

## 2020-10-28 DIAGNOSIS — M75122 Complete rotator cuff tear or rupture of left shoulder, not specified as traumatic: Secondary | ICD-10-CM | POA: Diagnosis not present

## 2020-11-06 DIAGNOSIS — M75122 Complete rotator cuff tear or rupture of left shoulder, not specified as traumatic: Secondary | ICD-10-CM | POA: Diagnosis not present

## 2020-11-19 DIAGNOSIS — Z09 Encounter for follow-up examination after completed treatment for conditions other than malignant neoplasm: Secondary | ICD-10-CM | POA: Diagnosis not present

## 2020-11-19 DIAGNOSIS — M75122 Complete rotator cuff tear or rupture of left shoulder, not specified as traumatic: Secondary | ICD-10-CM | POA: Diagnosis not present

## 2021-01-02 DIAGNOSIS — M25512 Pain in left shoulder: Secondary | ICD-10-CM | POA: Diagnosis not present

## 2021-04-13 DIAGNOSIS — M170 Bilateral primary osteoarthritis of knee: Secondary | ICD-10-CM | POA: Diagnosis not present

## 2021-04-13 DIAGNOSIS — M1712 Unilateral primary osteoarthritis, left knee: Secondary | ICD-10-CM | POA: Diagnosis not present

## 2021-04-13 DIAGNOSIS — M1711 Unilateral primary osteoarthritis, right knee: Secondary | ICD-10-CM | POA: Diagnosis not present

## 2021-05-06 DIAGNOSIS — R6889 Other general symptoms and signs: Secondary | ICD-10-CM | POA: Diagnosis not present

## 2021-05-06 DIAGNOSIS — H532 Diplopia: Secondary | ICD-10-CM | POA: Diagnosis not present

## 2021-05-07 ENCOUNTER — Other Ambulatory Visit: Payer: Self-pay | Admitting: Family Medicine

## 2021-05-07 DIAGNOSIS — H532 Diplopia: Secondary | ICD-10-CM

## 2021-05-08 ENCOUNTER — Ambulatory Visit
Admission: RE | Admit: 2021-05-08 | Discharge: 2021-05-08 | Disposition: A | Discharge status: Home / Self Care | Payer: BC Managed Care – PPO | Source: Ambulatory Visit | Attending: Family Medicine | Admitting: Family Medicine

## 2021-05-08 ENCOUNTER — Other Ambulatory Visit: Payer: Self-pay

## 2021-05-08 DIAGNOSIS — H532 Diplopia: Secondary | ICD-10-CM

## 2021-05-08 DIAGNOSIS — H5319 Other subjective visual disturbances: Secondary | ICD-10-CM | POA: Diagnosis not present

## 2021-05-08 DIAGNOSIS — H5052 Exophoria: Secondary | ICD-10-CM | POA: Diagnosis not present

## 2021-05-08 IMAGING — MR MR HEAD WO/W CM
11 series · 48 of 48 positions shown · IV contrast (multihance)
Comparison: None.

CLINICAL DATA: Diplopia

EXAM:
MRI HEAD WITHOUT AND WITH CONTRAST
TECHNIQUE: Multiplanar, multiecho pulse sequences of the brain and surrounding
structures were obtained without and with intravenous contrast.
CONTRAST:  15mL MULTIHANCE GADOBENATE DIMEGLUMINE 529 MG/ML IV SOLN

[Series 5: T1 · sagittal · 4.0mm · 0.75mm/px · 2 of 31 slices shown (1 of 2)]
[im 1/31]
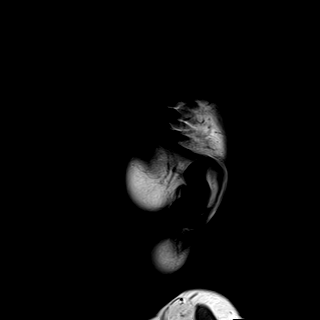
[im 31/31]
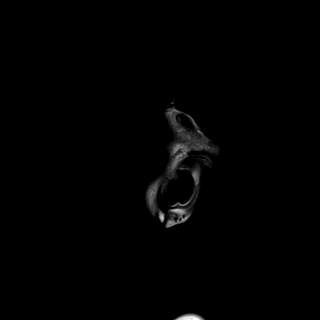

[Series 6: DWI · axial · 3.0mm · 0.94mm/px · z∈[-69,+71]mm · 11 of 157 slices shown (1 of 3)]
[im 1/157]
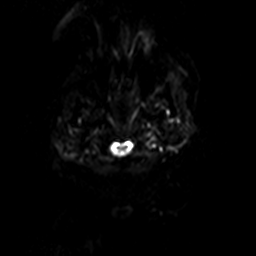
[im 16/157]
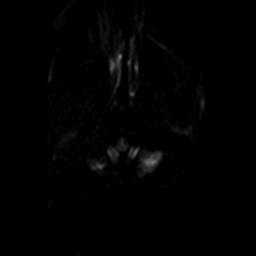
[im 32/157]
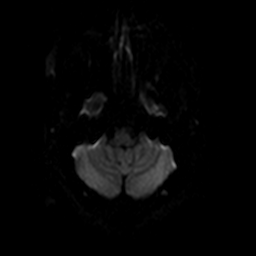
[im 47/157]
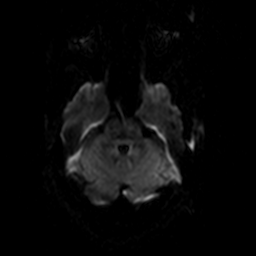
[im 63/157]
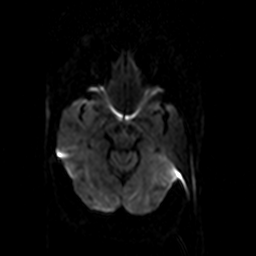
[im 79/157]
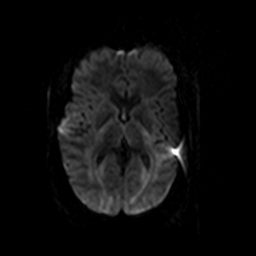
[im 94/157]
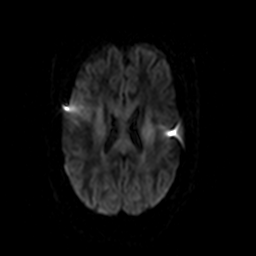
[im 110/157]
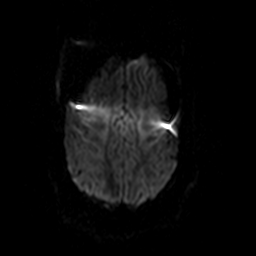
[im 125/157]
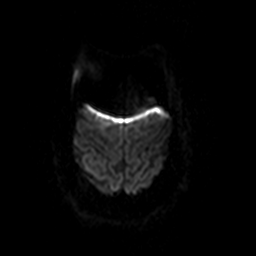
[im 141/157]
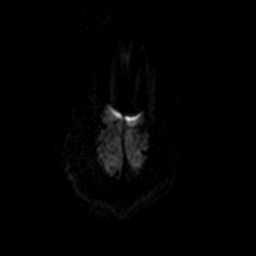
[im 157/157]
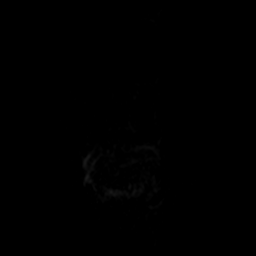

[Series 7: ax dwi_tracew · axial · 3.0mm · 0.94mm/px · z∈[-69,+71]mm · 6 of 79 slices shown]
[im 1/79]
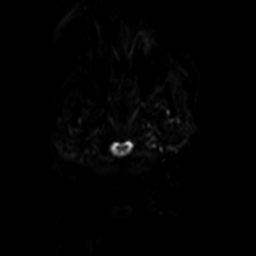
[im 16/79]
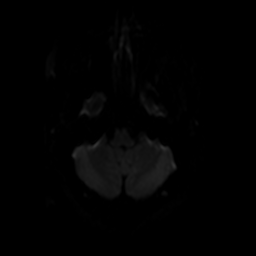
[im 32/79]
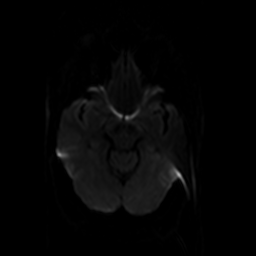
[im 47/79]
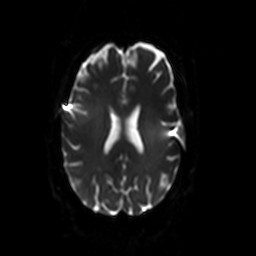
[im 63/79]
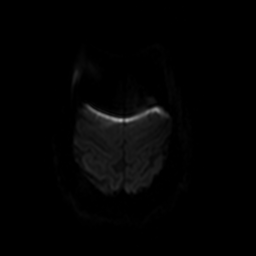
[im 79/79]
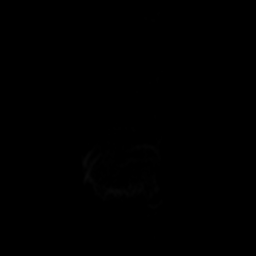

[Series 8: ax dwi_adc · axial · 3.0mm · 0.94mm/px · z∈[-69,+71]mm · 3 of 40 slices shown]
[im 1/40]
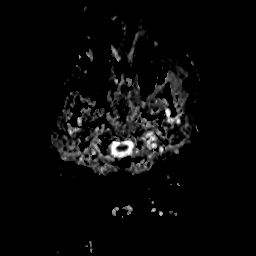
[im 20/40]
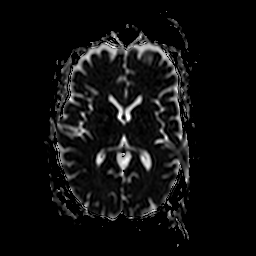
[im 40/40]
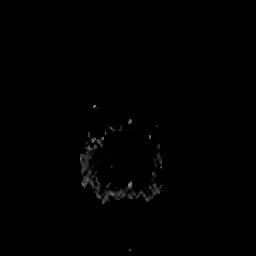

[Series 9: DWI · coronal · 5.0mm · 1.44mm/px · 5 of 66 slices shown (2 of 3)]
[im 1/66]
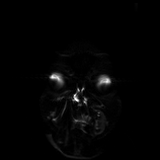
[im 17/66]
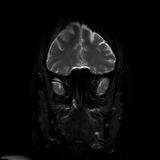
[im 33/66]
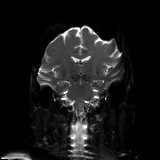
[im 49/66]
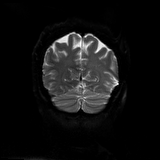
[im 66/66]
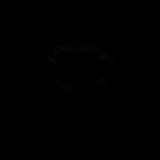

[Series 10: DWI · coronal · 5.0mm · 1.44mm/px · 2 of 33 slices shown (3 of 3)]
[im 1/33]
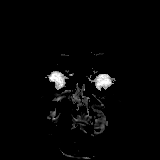
[im 33/33]
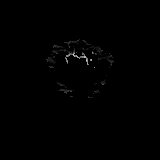

[Series 11: T2 · axial · 4.0mm · 0.36mm/px · z∈[-77,+57]mm · 2 of 27 slices shown]
[im 1/27]
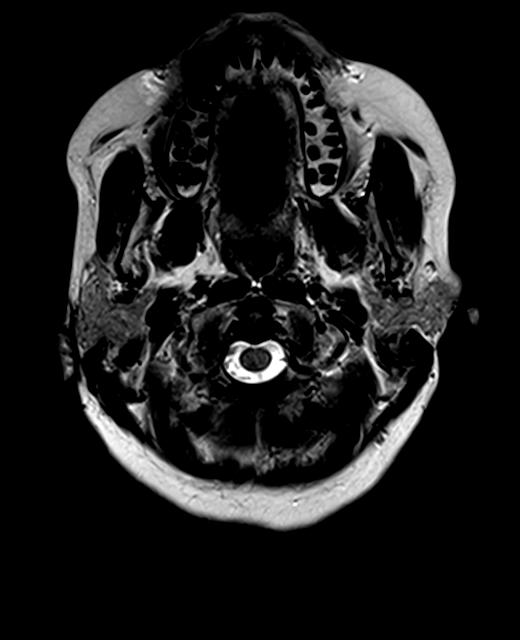
[im 27/27]
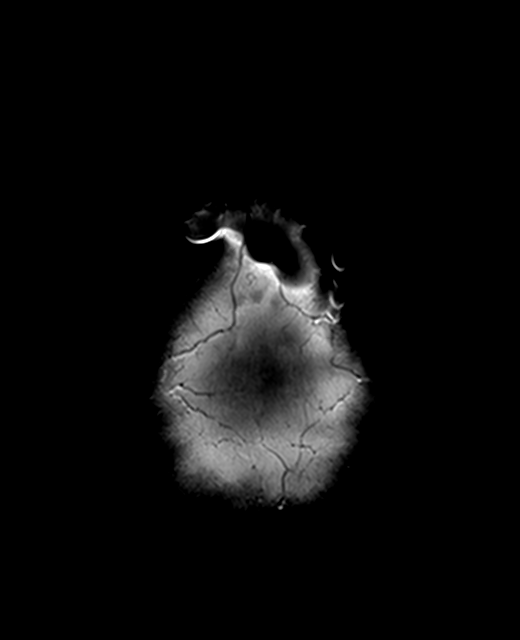

[Series 12: FLAIR · axial · 3.0mm · 0.72mm/px · z∈[-87,+62]mm · 2 of 26 slices shown]
[im 1/26]
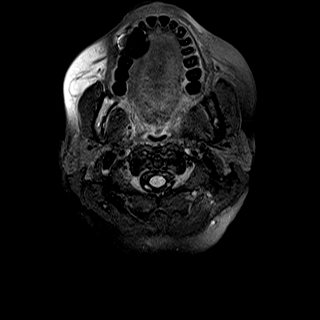
[im 26/26]
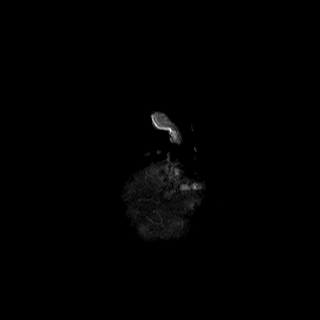

[Series 16: T2 post-contrast · coronal · 4.0mm · 0.36mm/px · 2 of 34 slices shown]
[im 1/34]
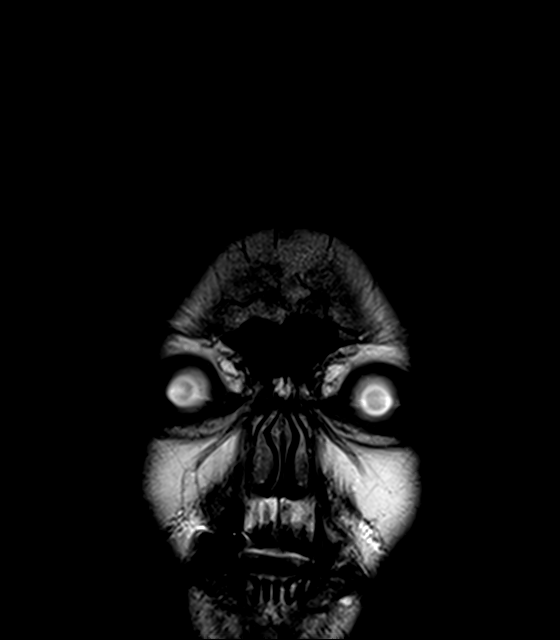
[im 34/34]
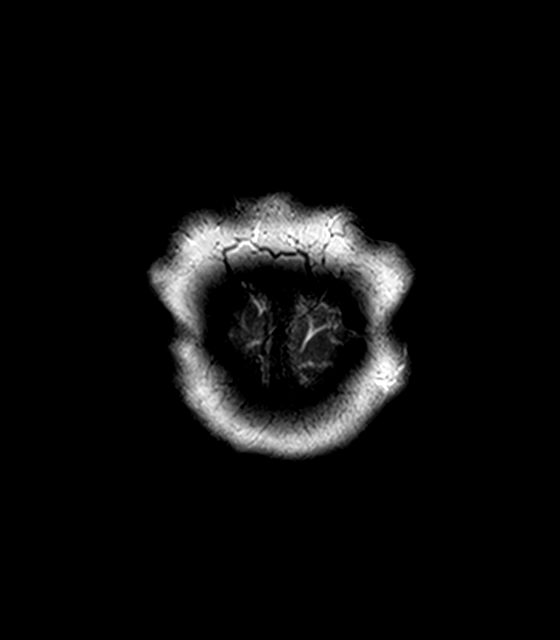

[Series 17: T1 · axial · 1.0mm · 0.94mm/px · z∈[-100,+56]mm · 11 of 159 slices shown (2 of 2)]
[im 1/159]
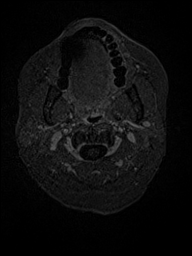
[im 16/159]
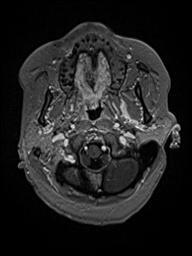
[im 32/159]
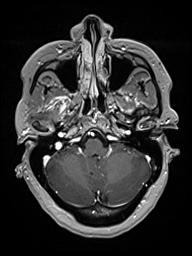
[im 48/159]
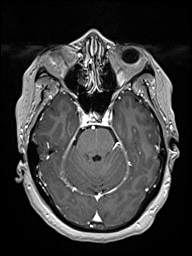
[im 64/159]
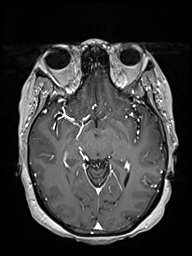
[im 80/159]
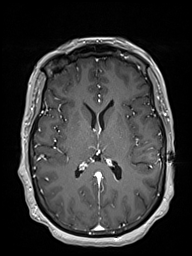
[im 95/159]
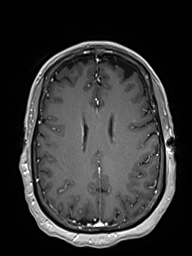
[im 111/159]
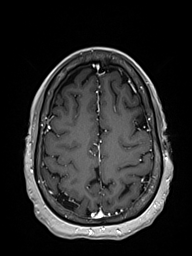
[im 127/159]
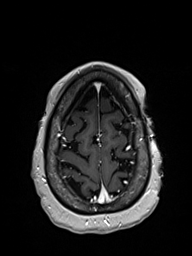
[im 143/159]
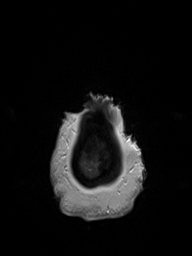
[im 159/159]
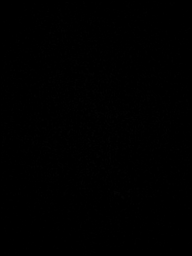

[Series 18: T1 post-contrast · coronal · 4.0mm · 0.72mm/px · 2 of 34 slices shown]
[im 1/34]
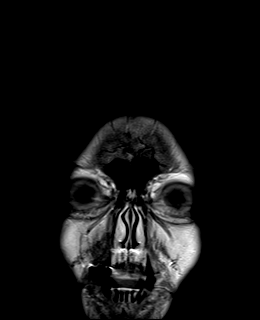
[im 34/34]
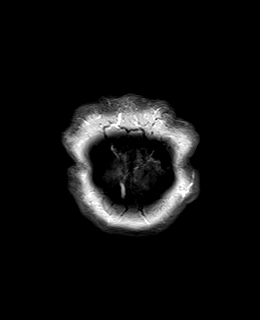

[48 of 48 positions shown; findings below may reference images not displayed]

FINDINGS: Brain: There is no acute infarction or intracranial hemorrhage.
There is no intracranial mass, mass effect, or edema. There is no
hydrocephalus or extra-axial fluid collection. Ventricles and sulci
are normal in size and configuration. No abnormal enhancement.

Vascular: Major vessel flow voids at the skull base are preserved.

Skull and upper cervical spine: Normal marrow signal is preserved.

Sinuses/Orbits: Minor mucosal thickening.  Orbits are unremarkable.

Other: Sella is unremarkable.  Mastoid air cells are clear.
IMPRESSION: No evidence of recent infarction, hemorrhage, or mass. No abnormal
enhancement.

## 2021-05-08 MED ORDER — GADOBENATE DIMEGLUMINE 529 MG/ML IV SOLN
15.0000 mL | Freq: Once | INTRAVENOUS | Status: AC | PRN
  Administered 2021-05-08: 15 mL via INTRAVENOUS

## 2021-05-21 DIAGNOSIS — H538 Other visual disturbances: Secondary | ICD-10-CM | POA: Diagnosis not present

## 2021-05-21 DIAGNOSIS — H532 Diplopia: Secondary | ICD-10-CM | POA: Diagnosis not present

## 2021-05-21 DIAGNOSIS — G43909 Migraine, unspecified, not intractable, without status migrainosus: Secondary | ICD-10-CM | POA: Diagnosis not present

## 2021-05-29 DIAGNOSIS — M25512 Pain in left shoulder: Secondary | ICD-10-CM | POA: Diagnosis not present

## 2021-06-23 DIAGNOSIS — H538 Other visual disturbances: Secondary | ICD-10-CM | POA: Diagnosis not present

## 2021-06-23 DIAGNOSIS — H532 Diplopia: Secondary | ICD-10-CM | POA: Diagnosis not present

## 2021-06-23 DIAGNOSIS — G43909 Migraine, unspecified, not intractable, without status migrainosus: Secondary | ICD-10-CM | POA: Diagnosis not present

## 2021-07-15 DIAGNOSIS — M170 Bilateral primary osteoarthritis of knee: Secondary | ICD-10-CM | POA: Diagnosis not present

## 2021-07-15 DIAGNOSIS — M25561 Pain in right knee: Secondary | ICD-10-CM | POA: Diagnosis not present

## 2021-07-15 DIAGNOSIS — M25562 Pain in left knee: Secondary | ICD-10-CM | POA: Diagnosis not present

## 2021-09-07 DIAGNOSIS — G43909 Migraine, unspecified, not intractable, without status migrainosus: Secondary | ICD-10-CM | POA: Diagnosis not present

## 2021-09-08 DIAGNOSIS — K5900 Constipation, unspecified: Secondary | ICD-10-CM | POA: Diagnosis not present

## 2021-09-08 DIAGNOSIS — F329 Major depressive disorder, single episode, unspecified: Secondary | ICD-10-CM | POA: Diagnosis not present

## 2021-09-08 DIAGNOSIS — I10 Essential (primary) hypertension: Secondary | ICD-10-CM | POA: Diagnosis not present

## 2021-11-30 DIAGNOSIS — M170 Bilateral primary osteoarthritis of knee: Secondary | ICD-10-CM | POA: Diagnosis not present

## 2021-12-24 DIAGNOSIS — I10 Essential (primary) hypertension: Secondary | ICD-10-CM | POA: Diagnosis not present

## 2021-12-24 DIAGNOSIS — G43909 Migraine, unspecified, not intractable, without status migrainosus: Secondary | ICD-10-CM | POA: Diagnosis not present

## 2021-12-24 DIAGNOSIS — Z79899 Other long term (current) drug therapy: Secondary | ICD-10-CM | POA: Diagnosis not present

## 2022-03-30 DIAGNOSIS — K5900 Constipation, unspecified: Secondary | ICD-10-CM | POA: Diagnosis not present

## 2022-03-30 DIAGNOSIS — I10 Essential (primary) hypertension: Secondary | ICD-10-CM | POA: Diagnosis not present

## 2022-03-30 DIAGNOSIS — F329 Major depressive disorder, single episode, unspecified: Secondary | ICD-10-CM | POA: Diagnosis not present

## 2022-03-31 DIAGNOSIS — M170 Bilateral primary osteoarthritis of knee: Secondary | ICD-10-CM | POA: Diagnosis not present

## 2022-05-03 DIAGNOSIS — Z79899 Other long term (current) drug therapy: Secondary | ICD-10-CM | POA: Diagnosis not present

## 2022-05-03 DIAGNOSIS — G43909 Migraine, unspecified, not intractable, without status migrainosus: Secondary | ICD-10-CM | POA: Diagnosis not present

## 2022-05-18 DIAGNOSIS — M25511 Pain in right shoulder: Secondary | ICD-10-CM | POA: Diagnosis not present

## 2022-05-18 DIAGNOSIS — M25512 Pain in left shoulder: Secondary | ICD-10-CM | POA: Diagnosis not present

## 2022-05-30 DIAGNOSIS — M25511 Pain in right shoulder: Secondary | ICD-10-CM | POA: Diagnosis not present

## 2022-06-08 DIAGNOSIS — M75121 Complete rotator cuff tear or rupture of right shoulder, not specified as traumatic: Secondary | ICD-10-CM | POA: Diagnosis not present

## 2022-06-08 DIAGNOSIS — M25512 Pain in left shoulder: Secondary | ICD-10-CM | POA: Diagnosis not present

## 2022-06-08 DIAGNOSIS — M25511 Pain in right shoulder: Secondary | ICD-10-CM | POA: Diagnosis not present

## 2022-06-30 DIAGNOSIS — M170 Bilateral primary osteoarthritis of knee: Secondary | ICD-10-CM | POA: Diagnosis not present

## 2022-07-22 ENCOUNTER — Encounter: Payer: Self-pay | Admitting: *Deleted

## 2022-08-04 DIAGNOSIS — Y999 Unspecified external cause status: Secondary | ICD-10-CM | POA: Diagnosis not present

## 2022-08-04 DIAGNOSIS — M7551 Bursitis of right shoulder: Secondary | ICD-10-CM | POA: Diagnosis not present

## 2022-08-04 DIAGNOSIS — M25111 Fistula, right shoulder: Secondary | ICD-10-CM | POA: Diagnosis not present

## 2022-08-04 DIAGNOSIS — M75121 Complete rotator cuff tear or rupture of right shoulder, not specified as traumatic: Secondary | ICD-10-CM | POA: Diagnosis not present

## 2022-08-04 DIAGNOSIS — X58XXXA Exposure to other specified factors, initial encounter: Secondary | ICD-10-CM | POA: Diagnosis not present

## 2022-08-04 DIAGNOSIS — M948X6 Other specified disorders of cartilage, lower leg: Secondary | ICD-10-CM | POA: Diagnosis not present

## 2022-08-04 DIAGNOSIS — M24111 Other articular cartilage disorders, right shoulder: Secondary | ICD-10-CM | POA: Diagnosis not present

## 2022-08-04 DIAGNOSIS — G8918 Other acute postprocedural pain: Secondary | ICD-10-CM | POA: Diagnosis not present

## 2022-08-04 DIAGNOSIS — S46011A Strain of muscle(s) and tendon(s) of the rotator cuff of right shoulder, initial encounter: Secondary | ICD-10-CM | POA: Diagnosis not present

## 2022-08-04 DIAGNOSIS — S46191A Other injury of muscle, fascia and tendon of long head of biceps, right arm, initial encounter: Secondary | ICD-10-CM | POA: Diagnosis not present

## 2022-08-04 DIAGNOSIS — M7541 Impingement syndrome of right shoulder: Secondary | ICD-10-CM | POA: Diagnosis not present

## 2022-08-04 DIAGNOSIS — S43431A Superior glenoid labrum lesion of right shoulder, initial encounter: Secondary | ICD-10-CM | POA: Diagnosis not present

## 2022-09-15 DIAGNOSIS — M25561 Pain in right knee: Secondary | ICD-10-CM | POA: Diagnosis not present

## 2022-09-15 DIAGNOSIS — M1712 Unilateral primary osteoarthritis, left knee: Secondary | ICD-10-CM | POA: Diagnosis not present

## 2022-12-29 DIAGNOSIS — M170 Bilateral primary osteoarthritis of knee: Secondary | ICD-10-CM | POA: Diagnosis not present

## 2022-12-31 DIAGNOSIS — G43009 Migraine without aura, not intractable, without status migrainosus: Secondary | ICD-10-CM | POA: Diagnosis not present

## 2023-01-03 DIAGNOSIS — F329 Major depressive disorder, single episode, unspecified: Secondary | ICD-10-CM | POA: Diagnosis not present

## 2023-01-03 DIAGNOSIS — I10 Essential (primary) hypertension: Secondary | ICD-10-CM | POA: Diagnosis not present

## 2023-01-03 DIAGNOSIS — K5900 Constipation, unspecified: Secondary | ICD-10-CM | POA: Diagnosis not present

## 2023-01-03 DIAGNOSIS — Z23 Encounter for immunization: Secondary | ICD-10-CM | POA: Diagnosis not present

## 2023-01-13 ENCOUNTER — Encounter (HOSPITAL_COMMUNITY): Payer: Self-pay

## 2023-01-13 ENCOUNTER — Emergency Department (HOSPITAL_COMMUNITY)
Admission: EM | Admit: 2023-01-13 | Discharge: 2023-01-14 | Disposition: A | Discharge status: Home / Self Care | Payer: BC Managed Care – PPO | Attending: Emergency Medicine | Admitting: Emergency Medicine

## 2023-01-13 ENCOUNTER — Emergency Department (HOSPITAL_COMMUNITY): Payer: BC Managed Care – PPO

## 2023-01-13 DIAGNOSIS — R220 Localized swelling, mass and lump, head: Secondary | ICD-10-CM | POA: Diagnosis not present

## 2023-01-13 DIAGNOSIS — K122 Cellulitis and abscess of mouth: Secondary | ICD-10-CM | POA: Diagnosis not present

## 2023-01-13 DIAGNOSIS — Z79899 Other long term (current) drug therapy: Secondary | ICD-10-CM | POA: Diagnosis not present

## 2023-01-13 DIAGNOSIS — I10 Essential (primary) hypertension: Secondary | ICD-10-CM | POA: Insufficient documentation

## 2023-01-13 DIAGNOSIS — L0291 Cutaneous abscess, unspecified: Secondary | ICD-10-CM | POA: Diagnosis not present

## 2023-01-13 DIAGNOSIS — R221 Localized swelling, mass and lump, neck: Secondary | ICD-10-CM | POA: Diagnosis not present

## 2023-01-13 LAB — CBC WITH DIFFERENTIAL/PLATELET
Abs Immature Granulocytes: 0.04 10*3/uL (ref 0.00–0.07)
Basophils Absolute: 0 10*3/uL (ref 0.0–0.1)
Basophils Relative: 0 %
Eosinophils Absolute: 0 10*3/uL (ref 0.0–0.5)
Eosinophils Relative: 0 %
HCT: 31.4 % — ABNORMAL LOW (ref 36.0–46.0)
Hemoglobin: 10 g/dL — ABNORMAL LOW (ref 12.0–15.0)
Immature Granulocytes: 0 %
Lymphocytes Relative: 10 %
Lymphs Abs: 0.9 10*3/uL (ref 0.7–4.0)
MCH: 32.9 pg (ref 26.0–34.0)
MCHC: 31.8 g/dL (ref 30.0–36.0)
MCV: 103.3 fL — ABNORMAL HIGH (ref 80.0–100.0)
Monocytes Absolute: 0.8 10*3/uL (ref 0.1–1.0)
Monocytes Relative: 8 %
Neutro Abs: 7.8 10*3/uL — ABNORMAL HIGH (ref 1.7–7.7)
Neutrophils Relative %: 82 %
Platelets: 299 10*3/uL (ref 150–400)
RBC: 3.04 MIL/uL — ABNORMAL LOW (ref 3.87–5.11)
RDW: 13.4 % (ref 11.5–15.5)
WBC: 9.5 10*3/uL (ref 4.0–10.5)
nRBC: 0 % (ref 0.0–0.2)

## 2023-01-13 LAB — COMPREHENSIVE METABOLIC PANEL
ALT: 37 U/L (ref 0–44)
AST: 41 U/L (ref 15–41)
Albumin: 3.6 g/dL (ref 3.5–5.0)
Alkaline Phosphatase: 71 U/L (ref 38–126)
Anion gap: 9 (ref 5–15)
BUN: 10 mg/dL (ref 6–20)
CO2: 21 mmol/L — ABNORMAL LOW (ref 22–32)
Calcium: 8.8 mg/dL — ABNORMAL LOW (ref 8.9–10.3)
Chloride: 106 mmol/L (ref 98–111)
Creatinine, Ser: 0.78 mg/dL (ref 0.44–1.00)
GFR, Estimated: >60 mL/min (ref >60)
Glucose, Bld: 87 mg/dL (ref 70–99)
Potassium: 3.4 mmol/L — ABNORMAL LOW (ref 3.5–5.1)
Sodium: 136 mmol/L (ref 135–145)
Total Bilirubin: 0.9 mg/dL (ref 0.3–1.2)
Total Protein: 7.5 g/dL (ref 6.5–8.1)

## 2023-01-13 MED ORDER — MORPHINE SULFATE (PF) 4 MG/ML IV SOLN
4.0000 mg | Freq: Once | INTRAVENOUS | Status: AC
  Administered 2023-01-13: 4 mg via INTRAVENOUS
  Filled 2023-01-13: qty 1

## 2023-01-13 MED ORDER — IOHEXOL 300 MG/ML  SOLN
75.0000 mL | Freq: Once | INTRAMUSCULAR | Status: AC | PRN
  Administered 2023-01-13: 75 mL via INTRAVENOUS

## 2023-01-13 MED ORDER — HYDROMORPHONE HCL 1 MG/ML IJ SOLN
1.0000 mg | Freq: Once | INTRAMUSCULAR | Status: AC
  Administered 2023-01-13: 1 mg via INTRAVENOUS
  Filled 2023-01-13: qty 1

## 2023-01-13 MED ORDER — DEXAMETHASONE SODIUM PHOSPHATE 10 MG/ML IJ SOLN
10.0000 mg | Freq: Once | INTRAMUSCULAR | Status: AC
  Administered 2023-01-13: 10 mg via INTRAVENOUS
  Filled 2023-01-13: qty 1

## 2023-01-13 MED ORDER — SODIUM CHLORIDE 0.9 % IV SOLN
3.0000 g | Freq: Once | INTRAVENOUS | Status: AC
  Administered 2023-01-13: 3 g via INTRAVENOUS
  Filled 2023-01-13: qty 8

## 2023-01-13 NOTE — ED Provider Notes (Incomplete)
[place] EMERGENCY DEPARTMENT AT [facility] Provider Note   CSN: [id number] Arrival date & time: 01/13/23  1233     History {Add pertinent medical, surgical, social history, OB history to HPI:1} Chief Complaint  Patient presents with  . Oral Swelling    MIU CHIONG is a 52 y.o. female who presents with right-sided facial and neck swelling for 5 days.  Reports she is having more difficulty swallowing.  Denies any fever or chills at home.  Denies any recent dental work.  Denies any shortness of breath or difficulty breathing.  Denies any fever or chills, denies any recent illnesses.  HPI     Home Medications Prior to Admission medications   Medication Sig Start Date End Date Taking? Authorizing Provider  cyclobenzaprine (FLEXERIL) 5 MG tablet Take 1 tablet (5 mg total) by mouth 3 (three) times daily as needed for muscle spasms. 06/17/20   Myra Rude, MD  Diclofenac Sodium (PENNSAID) 2 % SOLN Place 1 application onto the skin 2 (two) times daily. 03/26/20   Myra Rude, MD  escitalopram (LEXAPRO) 10 MG tablet Take 10 mg by mouth every morning. 12/18/14   [provider]  FLUoxetine (PROZAC) 40 MG capsule Take 40 mg by mouth daily. 03/03/20   [provider]  HYDROcodone-acetaminophen (NORCO/VICODIN) 5-325 MG tablet Take 1 tablet by mouth every 8 (eight) hours as needed. 06/17/20   Myra Rude, MD  lidocaine (LIDODERM) 5 % Place 1 patch onto the skin daily. Remove & Discard patch within 12 hours or as directed by MD 04/25/15   Ward, Layla Maw, DO  LINZESS 290 MCG CAPS capsule Take 290 mcg by mouth daily. 10/17/19   [provider]  lisinopril (PRINIVIL,ZESTRIL) 20 MG tablet Take 20 mg by mouth daily.    [provider]  lisinopril (ZESTRIL) 40 MG tablet Take 40 mg by mouth daily. 03/03/20   [provider]  ondansetron (ZOFRAN ODT) 4 MG disintegrating tablet Take 1 tablet (4 mg total) by mouth every 8 (eight)  hours as needed for nausea or vomiting. 04/25/15   Ward, Layla Maw, DO  predniSONE (DELTASONE) 5 MG tablet Take 6 pills for first day, 5 pills second day, 4 pills third day, 3 pills fourth day, 2 pills the fifth day, and 1 pill sixth day. 03/26/20   Myra Rude, MD  topiramate (TOPAMAX) 100 MG tablet  03/03/20   [provider]      Allergies    Patient has no known allergies.    Review of Systems   Review of Systems  Musculoskeletal:  Positive for neck pain.    Physical Exam Updated Vital Signs BP (!) 142/107 (BP Location: Left Arm)   Pulse 80   Temp 97.8 F (36.6 C) (Oral)   Resp 18   SpO2 100%  Physical Exam Vitals and nursing note reviewed.  Constitutional:      General: She is not in acute distress.    Appearance: She is well-developed.  HENT:     Head: Normocephalic and atraumatic.     Mouth/Throat:     Comments: Patient with trismus, unable to open mouth fully.  No muffled voice, tolerating secretions. Patient with significant right sided submandibular and submental firm mass appreciated on exam.  Left side of neck is soft and supple  Unable to fully visualize posterior oropharynx, no obvious dental abscesses  Eyes:     Conjunctiva/sclera: Conjunctivae normal.  Cardiovascular:     Rate and  Rhythm: Normal rate and regular rhythm.     Heart sounds: No murmur heard. Pulmonary:     Effort: Pulmonary effort is normal. No respiratory distress.     Breath sounds: Normal breath sounds.     Comments: Patient breathing comfortably on room air, talking in full sentences.  Lungs clear to auscultation bilaterally, no stridor Abdominal:     Palpations: Abdomen is soft.     Tenderness: There is no abdominal tenderness.  Musculoskeletal:        General: No swelling.     Cervical back: Neck supple.  Skin:    General: Skin is warm and dry.     Capillary Refill: Capillary refill takes less than 2 seconds.  Neurological:     Mental Status: She is alert.   Psychiatric:        Mood and Affect: Mood normal.     ED Results / Procedures / Treatments   Labs (all labs ordered are listed, but only abnormal results are displayed) Labs Reviewed  CBC WITH DIFFERENTIAL/PLATELET  COMPREHENSIVE METABOLIC PANEL  I-STAT CREATININE, ED    EKG None  Radiology No results found.  Procedures Procedures  {Document cardiac monitor, telemetry assessment procedure when appropriate:1}  Medications Ordered in ED Medications - No data to display  ED Course/ Medical Decision Making/ A&P   {   Click here for ABCD2, HEART and other calculatorsREFRESH Note before signing :1}                              Medical Decision Making Amount and/or Complexity of Data Reviewed Labs: ordered. Radiology: ordered.  Risk Prescription drug management.   52 y.o. female with pertinent past medical history of *** presents to the ED for concern of ***   Differential diagnosis includes but is not limited to ***  ED Course:  *** Upon re-evaluation, patient ***.    Impression: ***  Disposition:  {AF ED Dispo:29713} Return precautions given.  Lab Tests: I Ordered, and personally interpreted labs.  The pertinent results include:  ***  Imaging Studies ordered: I ordered imaging studies including CT neck soft tissue  I independently visualized the imaging with scope of interpretation limited to determining acute life threatening conditions related to emergency care. Imaging showed  3.8 x 3.0 x 3.6 cm abscess of the right submandibular space extending from the right third mandibular molar I agree with the radiologist interpretation   Consultations Obtained: I requested consultation with the ENT doctor on call,  and discussed lab and imaging findings as well as pertinent plan - they recommend: getting maxillofacial involved, they would not be able to treat from an ENT perspective given dental infection source. I requested consultation with the hospitalist  Dr Julian Reil,  and discussed lab and imaging findings as well as pertinent plan - they recommend: getting maxillofacial involved, they would not be able to treat from an ENT perspective given dental infection source. Talked via physician access line with  [facility] to er   [place].   Oral surgery   External records from outside source obtained and reviewed including ***   Co morbidities that complicate the patient evaluation  ***  Social Determinants of Health:  {WGNF:62130}       {Document critical care time when appropriate:1} {Document review of labs and clinical decision tools ie heart score, Chads2Vasc2 etc:1}  {Document your independent review of radiology images, and any outside records:1} {Document your discussion with  family members, caretakers, and with consultants:1} {Document social determinants of health affecting pt's care:1} {Document your decision making why or why not admission, treatments were needed:1} Final Clinical Impression(s) / ED Diagnoses Final diagnoses:  None    Rx / DC Orders ED Discharge Orders     None

## 2023-01-13 NOTE — ED Provider Notes (Signed)
[facility] AT [facility] Provider Note   CSN: [id number] Arrival date & time: 01/13/23  1233     History  Chief Complaint  Patient presents with   Oral Swelling    Vickie Mclean is a 52 y.o. female who presents with right-sided facial and neck swelling for 5 days.  Reports she is having more difficulty swallowing.  Denies any fever or chills at home.  Denies any recent dental work.  Denies any shortness of breath or difficulty breathing.  Denies any fever or chills, denies any recent illnesses.  HPI     Home Medications Prior to Admission medications   Medication Sig Start Date End Date Taking? Authorizing Provider  cyclobenzaprine (FLEXERIL) 5 MG tablet Take 1 tablet (5 mg total) by mouth 3 (three) times daily as needed for muscle spasms. 06/17/20   Myra Rude, MD  Diclofenac Sodium (PENNSAID) 2 % SOLN Place 1 application onto the skin 2 (two) times daily. 03/26/20   Myra Rude, MD  escitalopram (LEXAPRO) 10 MG tablet Take 10 mg by mouth every morning. 12/18/14   [provider]  FLUoxetine (PROZAC) 40 MG capsule Take 40 mg by mouth daily. 03/03/20   [provider]  HYDROcodone-acetaminophen (NORCO/VICODIN) 5-325 MG tablet Take 1 tablet by mouth every 8 (eight) hours as needed. 06/17/20   Myra Rude, MD  lidocaine (LIDODERM) 5 % Place 1 patch onto the skin daily. Remove & Discard patch within 12 hours or as directed by MD 04/25/15   Ward, Layla Maw, DO  LINZESS 290 MCG CAPS capsule Take 290 mcg by mouth daily. 10/17/19   [provider]  lisinopril (PRINIVIL,ZESTRIL) 20 MG tablet Take 20 mg by mouth daily.    [provider]  lisinopril (ZESTRIL) 40 MG tablet Take 40 mg by mouth daily. 03/03/20   [provider]  ondansetron (ZOFRAN ODT) 4 MG disintegrating tablet Take 1 tablet (4 mg total) by mouth every 8 (eight) hours as needed for nausea or vomiting. 04/25/15   Ward, Layla Maw, DO   predniSONE (DELTASONE) 5 MG tablet Take 6 pills for first day, 5 pills second day, 4 pills third day, 3 pills fourth day, 2 pills the fifth day, and 1 pill sixth day. 03/26/20   Myra Rude, MD  topiramate (TOPAMAX) 100 MG tablet  03/03/20   [provider]      Allergies    Patient has no known allergies.    Review of Systems   Review of Systems  Musculoskeletal:  Positive for neck pain.    Physical Exam Updated Vital Signs BP (!) 147/84 (BP Location: Left Arm)   Pulse (!) 107   Temp 98.8 F (37.1 C) (Oral)   Resp 17   SpO2 94%  Physical Exam Vitals and nursing note reviewed.  Constitutional:      General: She is not in acute distress.    Appearance: She is well-developed.  HENT:     Head: Normocephalic and atraumatic.     Mouth/Throat:     Comments: Patient with trismus, unable to open mouth fully.  No muffled voice, tolerating secretions. Patient with significant right sided submandibular and submental firm mass appreciated on exam.  Left side of neck is soft and supple  Unable to fully visualize posterior oropharynx, no obvious dental abscesses  Eyes:     Conjunctiva/sclera: Conjunctivae normal.  Cardiovascular:     Rate and Rhythm: Normal rate and regular rhythm.  Heart sounds: No murmur heard. Pulmonary:     Effort: Pulmonary effort is normal. No respiratory distress.     Breath sounds: Normal breath sounds.     Comments: Patient breathing comfortably on room air, talking in full sentences.  Lungs clear to auscultation bilaterally, no stridor Abdominal:     Palpations: Abdomen is soft.     Tenderness: There is no abdominal tenderness.  Musculoskeletal:        General: No swelling.     Cervical back: Neck supple.  Skin:    General: Skin is warm and dry.     Capillary Refill: Capillary refill takes less than 2 seconds.  Neurological:     Mental Status: She is alert.  Psychiatric:        Mood and Affect: Mood normal.     ED Results /  Procedures / Treatments   Labs (all labs ordered are listed, but only abnormal results are displayed) Labs Reviewed  CBC WITH DIFFERENTIAL/PLATELET - Abnormal; Notable for the following components:      Result Value   RBC 3.04 (*)    Hemoglobin 10.0 (*)    HCT 31.4 (*)    MCV 103.3 (*)    Neutro Abs 7.8 (*)    All other components within normal limits  COMPREHENSIVE METABOLIC PANEL - Abnormal; Notable for the following components:   Potassium 3.4 (*)    CO2 21 (*)    Calcium 8.8 (*)    All other components within normal limits    EKG None  Radiology CT Soft Tissue Neck W Contrast  Result Date: 01/13/2023 CLINICAL DATA:  Initial evaluation for acute right-sided neck swelling. Trismus. EXAM: CT NECK WITH CONTRAST TECHNIQUE: Multidetector CT imaging of the neck was performed using the standard protocol following the bolus administration of intravenous contrast. RADIATION DOSE REDUCTION: This exam was performed according to the departmental dose-optimization program which includes automated exposure control, adjustment of the mA and/or kV according to patient size and/or use of iterative reconstruction technique. CONTRAST:  75mL OMNIPAQUE IOHEXOL 300 MG/ML  SOLN COMPARISON:  None Available. FINDINGS: Pharynx and larynx: Soft tissue swelling with hazy inflammatory stranding seen about the right face and right mandibular body, concerning for acute infection/cellulitis. Lucency with dehiscence of the alveolar ridge at the base of the right third mandibular molar, suspected to be the source of the infection (series 9, image 54). Irregular hypodense collection measuring 3.8 x 3.0 x 3.6 cm, consistent with abscess (series 3, image 63). Mild mass effect on the adjacent sublingual space without overt extension into the floor of mouth. Oral cavity otherwise within normal limits. Oropharynx and nasopharynx normal. No retropharyngeal collection. Negative epiglottis. Hypopharynx, supraglottic larynx,  glottis within normal limits. Subglottic airway deviated to the right but widely patent. Salivary glands: Parotid glands within normal limits. Left submandibular gland normal. Right submandibular gland absent or intimately associated with the above described abscess. Thyroid: Normal. Lymph nodes: Mildly prominent right upper level 1 B nodes, presumably reactive. No other enlarged or pathologic adenopathy. Vascular: Normal intravascular enhancement seen within the neck. Limited intracranial: Unremarkable. Visualized orbits: Unremarkable. Mastoids and visualized paranasal sinuses: Paranasal sinuses are clear. Mastoid air cells and middle ear cavities are well pneumatized and free of fluid. Skeleton: No discrete or worrisome osseous lesions. Mild spondylosis present at C5-6 and C6-7. Upper chest: No acute finding. Other: None. IMPRESSION: Soft tissue swelling with hazy inflammatory stranding about the right face and right mandibular body, concerning for acute infection/cellulitis. Lucency with dehiscence  of the alveolar ridge at the base of the right third mandibular molar, suspected to be the source of the infection. Associated 3.8 x 3.0 x 3.6 cm abscess as above. Electronically Signed   By: Rise Mu M.D.   On: 01/13/2023 20:44    Procedures .Critical Care  Performed by: Arabella Merles, PA-C Authorized by: Arabella Merles, PA-C   Critical care provider statement:    Critical care time (minutes):  45   Critical care was necessary to treat or prevent imminent or life-threatening deterioration of the following conditions: Submandibular abscess.   Critical care was time spent personally by me on the following activities:  Development of treatment plan with patient or surrogate, discussions with consultants, evaluation of patient's response to treatment, examination of patient, ordering and review of laboratory studies, ordering and review of radiographic studies, ordering and performing  treatments and interventions, pulse oximetry and re-evaluation of patient's condition   Care discussed with: accepting provider at another facility   Comments:     Frequent re-evaluation, contact with specialists, review of imaging. Patient condition needed frequent reevaluation given possibility of deterioration with airway compromise     Medications Ordered in ED Medications  iohexol (OMNIPAQUE) 300 MG/ML solution 75 mL (75 mLs Intravenous Contrast Given 01/13/23 1829)  morphine (PF) 4 MG/ML injection 4 mg (4 mg Intravenous Given 01/13/23 1848)  dexamethasone (DECADRON) injection 10 mg (10 mg Intravenous Given 01/13/23 2147)  Ampicillin-Sulbactam (UNASYN) 3 g in sodium chloride 0.9 % 100 mL IVPB (0 g Intravenous Stopped 01/14/23 0006)  HYDROmorphone (DILAUDID) injection 1 mg (1 mg Intravenous Given 01/13/23 2228)    ED Course/ Medical Decision Making/ A&P                                 Medical Decision Making Amount and/or Complexity of Data Reviewed Labs: ordered. Radiology: ordered.  Risk Prescription drug management.   52 y.o. female with pertinent past medical history of hypertension presents to the ED for concern of right neck swelling for 5 days  Differential diagnosis includes but is not limited to ludwig's angina, dental abscess, peritonsillar abscess  ED Course:  Patient overall in no acute distress, vital signs upon initial arrival are stable.  Patient with obvious right-sided neck swelling and trismus.  She is tolerating her secretions, able to swallow.  Breathing comfortably on room air.  No posterior oropharynx edema, protecting airway.  Given the significant edema of the right submandibular space, CT soft tissue neck was obtained.  This showed a 3.8 x 3.0 x 3.6 centimeter abscess of the right submandibular space.  Suspected the right third mandibular molar to be the source of infection.  Patient with no leukocytosis.  Patient was started on Unasyn and 10 mg  dexamethasone. Patient initially given 4 mg morphine for pain. Upon reevaluation, patient still well-appearing, reports pain has returned.  She was given 1 mg Dilaudid. I initially talked to our ENT team here, they are unable to manage this type of infection and recommended consulting with oral surgery.  Upon review of our on-call team, no oral surgery available here until 01/23/2023.  I then reached out to [facility] oral surgery/ENT team and spoke to on-call Dr. Smith Mince.  He is accepting patient and recommends ER to ER transfer.  I have spoken to our CT tech and he was sending the images over to [facility] via [facility].  Upon re-evaluation, patient still well-appearing.  Still protecting  airway, tolerating secretions.  Afebrile, but does have a slight tachycardia at 107.  Stable for transfer to [facility] ER at this time.   Impression: Submandibular abscess  Disposition:  Patient transferred to [facility] with accepting Dr. Smith Mince with ENT. ER to ER transfer  Lab Tests: I Ordered, and personally interpreted labs.  The pertinent results include:   CBC with no leukocytosis CMP unremarkable  Imaging Studies ordered: I ordered imaging studies including CT neck soft tissue  I independently visualized the imaging with scope of interpretation limited to determining acute life threatening conditions related to emergency care. Imaging showed  3.8 x 3.0 x 3.6 cm abscess of the right submandibular space extending from the right third mandibular molar I agree with the radiologist interpretation   Consultations Obtained: I requested consultation with the ENT doctor on call,  and discussed lab and imaging findings as well as pertinent plan - they recommend: getting maxillofacial involved, they would not be able to treat from an ENT perspective given dental infection source. I requested consultation with the hospitalist Dr Julian Reil,  and discussed lab and imaging findings as well as pertinent plan -  they recommend: transferring patient to [facility] for oral surgery managementat Talked via physician access line with Dr. Smith Mince with [facility] ENT, they are recommending transfer ER to ER for further management of the patient's right submandibular abscess.             Final Clinical Impression(s) / ED Diagnoses Final diagnoses:  Abscess of submandibular region    Rx / DC Orders ED Discharge Orders     None         Arabella Merles, PA-C 01/14/23 0033    Wynetta Fines, MD 01/14/23 1413

## 2023-01-13 NOTE — ED Triage Notes (Signed)
POV right side mouth and neck swelling and pain since Saturday. Seen dentist this morning and referred her to the ED for evaluation

## 2023-01-14 ENCOUNTER — Other Ambulatory Visit: Payer: Self-pay

## 2023-01-14 DIAGNOSIS — K047 Periapical abscess without sinus: Secondary | ICD-10-CM | POA: Diagnosis not present

## 2023-01-14 DIAGNOSIS — L0211 Cutaneous abscess of neck: Secondary | ICD-10-CM | POA: Diagnosis not present

## 2023-01-14 DIAGNOSIS — G43909 Migraine, unspecified, not intractable, without status migrainosus: Secondary | ICD-10-CM | POA: Diagnosis not present

## 2023-01-14 DIAGNOSIS — Z5982 Transportation insecurity: Secondary | ICD-10-CM | POA: Diagnosis not present

## 2023-01-14 DIAGNOSIS — Z79899 Other long term (current) drug therapy: Secondary | ICD-10-CM | POA: Diagnosis not present

## 2023-01-14 DIAGNOSIS — R221 Localized swelling, mass and lump, neck: Secondary | ICD-10-CM | POA: Diagnosis not present

## 2023-01-14 DIAGNOSIS — I10 Essential (primary) hypertension: Secondary | ICD-10-CM | POA: Diagnosis not present

## 2023-01-14 DIAGNOSIS — K122 Cellulitis and abscess of mouth: Secondary | ICD-10-CM | POA: Diagnosis not present

## 2023-01-14 MED ORDER — HYDROMORPHONE HCL 1 MG/ML IJ SOLN: 1.0000 mg | Freq: Once | INTRAMUSCULAR | Status: DC

## 2023-01-14 NOTE — ED Notes (Signed)
This RN called [facility] to set up transport for the transfer and [facility] stated it will be a couple hours. PA and MD made aware.

## 2023-01-31 DIAGNOSIS — L0211 Cutaneous abscess of neck: Secondary | ICD-10-CM | POA: Diagnosis not present

## 2023-05-05 DIAGNOSIS — M25562 Pain in left knee: Secondary | ICD-10-CM | POA: Diagnosis not present

## 2023-05-05 DIAGNOSIS — M25561 Pain in right knee: Secondary | ICD-10-CM | POA: Diagnosis not present

## 2023-06-10 DIAGNOSIS — F329 Major depressive disorder, single episode, unspecified: Secondary | ICD-10-CM | POA: Diagnosis not present

## 2023-06-10 DIAGNOSIS — I10 Essential (primary) hypertension: Secondary | ICD-10-CM | POA: Diagnosis not present

## 2023-06-24 DIAGNOSIS — M25562 Pain in left knee: Secondary | ICD-10-CM | POA: Diagnosis not present

## 2023-06-24 DIAGNOSIS — M170 Bilateral primary osteoarthritis of knee: Secondary | ICD-10-CM | POA: Diagnosis not present

## 2023-06-24 DIAGNOSIS — M25561 Pain in right knee: Secondary | ICD-10-CM | POA: Diagnosis not present

## 2023-08-03 DIAGNOSIS — M170 Bilateral primary osteoarthritis of knee: Secondary | ICD-10-CM | POA: Diagnosis not present

## 2023-10-27 DIAGNOSIS — Z4789 Encounter for other orthopedic aftercare: Secondary | ICD-10-CM | POA: Diagnosis not present

## 2023-11-11 DIAGNOSIS — M170 Bilateral primary osteoarthritis of knee: Secondary | ICD-10-CM | POA: Diagnosis not present

## 2023-11-29 DIAGNOSIS — G43009 Migraine without aura, not intractable, without status migrainosus: Secondary | ICD-10-CM | POA: Diagnosis not present

## 2023-12-12 DIAGNOSIS — M170 Bilateral primary osteoarthritis of knee: Secondary | ICD-10-CM | POA: Diagnosis not present

## 2023-12-13 DIAGNOSIS — G43009 Migraine without aura, not intractable, without status migrainosus: Secondary | ICD-10-CM | POA: Diagnosis not present

## 2024-03-05 DIAGNOSIS — G43009 Migraine without aura, not intractable, without status migrainosus: Secondary | ICD-10-CM | POA: Diagnosis not present

## 2024-03-28 DIAGNOSIS — M170 Bilateral primary osteoarthritis of knee: Secondary | ICD-10-CM | POA: Diagnosis not present

## 2024-03-30 DIAGNOSIS — F329 Major depressive disorder, single episode, unspecified: Secondary | ICD-10-CM | POA: Diagnosis not present

## 2024-03-30 DIAGNOSIS — I10 Essential (primary) hypertension: Secondary | ICD-10-CM | POA: Diagnosis not present

## 2024-03-30 DIAGNOSIS — K5900 Constipation, unspecified: Secondary | ICD-10-CM | POA: Diagnosis not present
